# Patient Record
Sex: Female | Born: 1970 | Race: Black or African American | Hispanic: No | Marital: Married | Smoking: Never smoker
Health system: Southern US, Community
[De-identification: ages and names within clinical notes are randomized; demographics above are authoritative.]

## PROBLEM LIST (undated history)

## (undated) DIAGNOSIS — M797 Fibromyalgia: Secondary | ICD-10-CM

## (undated) DIAGNOSIS — G4730 Sleep apnea, unspecified: Secondary | ICD-10-CM

## (undated) DIAGNOSIS — M1990 Unspecified osteoarthritis, unspecified site: Secondary | ICD-10-CM

## (undated) HISTORY — PX: CYSTOSCOPY: SUR368

## (undated) HISTORY — PX: SYNOVECTOMY: SHX133

## (undated) HISTORY — PX: KNEE SURGERY: SHX244

## (undated) HISTORY — PX: ABDOMINAL HYSTERECTOMY: SHX81

## (undated) HISTORY — DX: Sleep apnea, unspecified: G47.30

## (undated) HISTORY — PX: CARPAL TUNNEL RELEASE: SHX101

## (undated) HISTORY — DX: Unspecified osteoarthritis, unspecified site: M19.90

---

## 2003-08-30 ENCOUNTER — Emergency Department (HOSPITAL_COMMUNITY): Admission: EM | Admit: 2003-08-30 | Discharge: 2003-08-30 | Payer: Self-pay | Admitting: Emergency Medicine

## 2014-06-17 ENCOUNTER — Other Ambulatory Visit: Payer: Self-pay | Admitting: Surgery

## 2014-06-18 ENCOUNTER — Other Ambulatory Visit: Payer: Self-pay | Admitting: Surgery

## 2014-07-16 ENCOUNTER — Encounter: Payer: BLUE CROSS/BLUE SHIELD | Attending: Surgery | Admitting: Dietician

## 2014-07-16 ENCOUNTER — Encounter: Payer: Self-pay | Admitting: Dietician

## 2014-07-16 ENCOUNTER — Ambulatory Visit (INDEPENDENT_AMBULATORY_CARE_PROVIDER_SITE_OTHER): Payer: BLUE CROSS/BLUE SHIELD | Admitting: Licensed Clinical Social Worker

## 2014-07-16 ENCOUNTER — Encounter (HOSPITAL_COMMUNITY)
Admission: RE | Disposition: A | Discharge status: Home / Self Care | Payer: Self-pay | Source: Ambulatory Visit | Attending: Surgery

## 2014-07-16 ENCOUNTER — Ambulatory Visit (HOSPITAL_COMMUNITY)
Admission: RE | Admit: 2014-07-16 | Discharge: 2014-07-16 | Disposition: A | Discharge status: Home / Self Care | Payer: BLUE CROSS/BLUE SHIELD | Source: Ambulatory Visit | Attending: Surgery | Admitting: Surgery

## 2014-07-16 DIAGNOSIS — F4322 Adjustment disorder with anxiety: Secondary | ICD-10-CM | POA: Diagnosis not present

## 2014-07-16 DIAGNOSIS — Z6837 Body mass index (BMI) 37.0-37.9, adult: Secondary | ICD-10-CM | POA: Insufficient documentation

## 2014-07-16 DIAGNOSIS — Z713 Dietary counseling and surveillance: Secondary | ICD-10-CM | POA: Diagnosis not present

## 2014-07-16 HISTORY — PX: BREATH TEK H PYLORI: SHX5422

## 2014-07-16 SURGERY — BREATH TEST, FOR HELICOBACTER PYLORI

## 2014-07-16 NOTE — Progress Notes (Signed)
   07/16/14 0915  BREATH TEK ASSESSMENT  Referring MD Dr Ovidio Kinavid Newman  Time of Last PO Intake 1330  Baseline Breath At: 0729  Pranactin Given At: 0732  Post-Dose Breath At: 0747  Sample 1 4.4%  Sample 2 3.4%  Test Postive

## 2014-07-16 NOTE — Progress Notes (Signed)
  Pre-Op Assessment Visit:  Pre-Operative Gastric sleeve Surgery  Medical Nutrition Therapy:  Appt start time: 1120   End time:  1200.  Patient was seen on 07/16/14 for Pre-Operative Nutrition Assessment. [facility] on 07/16/14.   Preferred Learning Style:   No preference indicated   Learning Readiness:   Ready  Handouts given during visit include:  Pre-Op Goals Bariatric Surgery Protein Shakes   During the appointment today the following Pre-Op Goals were reviewed with the patient: Maintain or lose weight as instructed by your surgeon Make healthy food choices Begin to limit portion sizes Limited concentrated sugars and fried foods Keep fat/sugar in the single digits per serving on   food labels Practice CHEWING your food  (aim for 30 chews per bite or until applesauce consistency) Practice not drinking 15 minutes before, during, and 30 minutes after each meal/snack Avoid all carbonated beverages  Avoid/limit caffeinated beverages  Avoid all sugar-sweetened beverages Consume 3 meals per day; eat every 3-5 hours Make a list of non-food related activities Aim for 64-100 ounces of FLUID daily  Aim for at least 60-80 grams of PROTEIN daily Look for a liquid protein source that contain ?15 g protein and ?5 g carbohydrate  (ex: shakes, drinks, shots)  Patient-Centered Goals: -Knee and back pain relief -Prevention of obesity-related health problems  Demonstrated degree of understanding via:  Teach Back  Teaching Method Utilized:  Visual Auditory Hands on  Barriers to learning/adherence to lifestyle change: none  Patient to call the [facility] to enroll in Pre-Op and Post-Op Nutrition Education when surgery date is scheduled.

## 2014-07-17 ENCOUNTER — Encounter (HOSPITAL_COMMUNITY): Payer: Self-pay | Admitting: Surgery

## 2014-07-29 ENCOUNTER — Ambulatory Visit (HOSPITAL_COMMUNITY)
Admission: RE | Admit: 2014-07-29 | Discharge: 2014-07-29 | Disposition: A | Discharge status: Home / Self Care | Payer: BLUE CROSS/BLUE SHIELD | Source: Ambulatory Visit | Attending: Surgery | Admitting: Surgery

## 2014-07-29 ENCOUNTER — Other Ambulatory Visit: Payer: Self-pay

## 2014-07-29 DIAGNOSIS — Z6837 Body mass index (BMI) 37.0-37.9, adult: Secondary | ICD-10-CM | POA: Insufficient documentation

## 2014-07-29 DIAGNOSIS — Z01818 Encounter for other preprocedural examination: Secondary | ICD-10-CM | POA: Insufficient documentation

## 2014-07-29 DIAGNOSIS — K5900 Constipation, unspecified: Secondary | ICD-10-CM | POA: Diagnosis not present

## 2014-08-05 ENCOUNTER — Ambulatory Visit (INDEPENDENT_AMBULATORY_CARE_PROVIDER_SITE_OTHER): Payer: BLUE CROSS/BLUE SHIELD | Admitting: Licensed Clinical Social Worker

## 2014-08-05 DIAGNOSIS — F4322 Adjustment disorder with anxiety: Secondary | ICD-10-CM

## 2014-08-11 ENCOUNTER — Ambulatory Visit: Payer: BLUE CROSS/BLUE SHIELD | Admitting: Dietician

## 2014-09-01 ENCOUNTER — Encounter (HOSPITAL_COMMUNITY)
Admission: RE | Disposition: A | Discharge status: Home / Self Care | Payer: Self-pay | Source: Ambulatory Visit | Attending: Surgery

## 2014-09-01 ENCOUNTER — Ambulatory Visit (HOSPITAL_COMMUNITY)
Admission: RE | Admit: 2014-09-01 | Discharge: 2014-09-01 | Disposition: A | Discharge status: Home / Self Care | Payer: BLUE CROSS/BLUE SHIELD | Source: Ambulatory Visit | Attending: Surgery | Admitting: Surgery

## 2014-09-01 DIAGNOSIS — Z0189 Encounter for other specified special examinations: Secondary | ICD-10-CM | POA: Insufficient documentation

## 2014-09-01 HISTORY — PX: BREATH TEK H PYLORI: SHX5422

## 2014-09-01 SURGERY — BREATH TEST, FOR HELICOBACTER PYLORI

## 2014-09-01 NOTE — Progress Notes (Signed)
   09/01/14 1002  BREATH TEK ASSESSMENT  Referring MD Dr Ezzard Standing   Time of Last PO Intake 2300  Baseline Breath At: 0848  Pranactin Given At: 0851  Post-Dose Breath At: 0906  Sample 1 4.5  Sample 2 2.6  Test Postive

## 2014-09-02 ENCOUNTER — Encounter (HOSPITAL_COMMUNITY): Payer: Self-pay | Admitting: Surgery

## 2014-09-08 ENCOUNTER — Encounter: Payer: Self-pay | Admitting: Dietician

## 2014-09-08 ENCOUNTER — Encounter: Payer: BLUE CROSS/BLUE SHIELD | Attending: Surgery | Admitting: Dietician

## 2014-09-08 DIAGNOSIS — Z713 Dietary counseling and surveillance: Secondary | ICD-10-CM | POA: Diagnosis not present

## 2014-09-08 DIAGNOSIS — Z6837 Body mass index (BMI) 37.0-37.9, adult: Secondary | ICD-10-CM | POA: Insufficient documentation

## 2014-09-08 NOTE — Progress Notes (Signed)
Supervised Weight Loss:  Appt start time: 1115 end time:  1130.  SWL visit 1:  Primary concerns today: Gorgeous returns today for her 1st SWL appointment in preparation for gastric sleeve having maintained her weight. She has been working on drinking less sweet tea and more water. She has also started taking Flintstones complete multivitamin every morening. Maurica tried chocolate Premier protein shake and likes it. Has been snacking on sugar free popsicles.   Weight: 213.5 lbs BMI: 37.9  Goals: -Start going to the gym and develop a workout routine   Patient-Centered Goals: -Knee and back pain relief -Prevention of obesity-related health problems  MEDICATIONS: see list  Estimated energy needs: 1600-1800 calories  Progress Towards Goal(s):  In progress.   Nutritional Diagnosis:  [place]-3.3 Overweight/obesity related to past poor dietary habits and physical inactivity as evidenced by patient in SWL for pending bariatric surgery following dietary guidelines for continued weight loss.     Intervention:  Nutrition counseling provided.  Samples provided and patient instructed on proper use: Unjury protein powder (chicken soup - qty 1) Lot#: [id number] Exp: 06/2015  Monitoring/Evaluation:  Dietary intake, exercise, and body weight in 4 week(s).

## 2014-10-06 ENCOUNTER — Encounter: Payer: Self-pay | Admitting: Dietician

## 2014-10-06 ENCOUNTER — Encounter: Payer: BLUE CROSS/BLUE SHIELD | Attending: Surgery | Admitting: Dietician

## 2014-10-06 DIAGNOSIS — Z713 Dietary counseling and surveillance: Secondary | ICD-10-CM | POA: Insufficient documentation

## 2014-10-06 DIAGNOSIS — Z6837 Body mass index (BMI) 37.0-37.9, adult: Secondary | ICD-10-CM | POA: Diagnosis not present

## 2014-10-06 NOTE — Progress Notes (Signed)
Supervised Weight Loss:  Appt start time: 1210 end time:  1225  SWL visit 2:  Primary concerns today: Alexa Henderson returns today for her 2nd SWL appointment in preparation for gastric sleeve having lost 2.5 pounds. The antibiotics for H Pylori are making her sick and fatigued. She has realized she likes plain water if it is very cold. She tried the chicken soup flavored Unjury protein powder and likes it. She has another Breath Tek scheduled on August 15. Alexa Henderson reports that she has been practicing not drinking while eating. She has not been ordering a drink at restaurants. Still taking Flintstones vitamins.   Weight: 211 lbs BMI: 37.5  Goals: -Start walking for 30 minutes 3x a week after work  Patient-Centered Goals: -Knee and back pain relief -Prevention of obesity-related health problems  MEDICATIONS: see list  Estimated energy needs: 1600-1800 calories  Progress Towards Goal(s):  In progress.   Nutritional Diagnosis:  [place]-3.3 Overweight/obesity related to past poor dietary habits and physical inactivity as evidenced by patient in SWL for pending bariatric surgery following dietary guidelines for continued weight loss.     Intervention:  Nutrition counseling provided.   Monitoring/Evaluation:  Dietary intake, exercise, and body weight in 4 week(s).

## 2014-11-05 ENCOUNTER — Ambulatory Visit: Payer: Self-pay | Admitting: Dietician

## 2014-11-11 ENCOUNTER — Encounter: Payer: BLUE CROSS/BLUE SHIELD | Attending: Surgery | Admitting: Dietician

## 2014-11-11 ENCOUNTER — Encounter: Payer: Self-pay | Admitting: Dietician

## 2014-11-11 DIAGNOSIS — Z713 Dietary counseling and surveillance: Secondary | ICD-10-CM | POA: Diagnosis not present

## 2014-11-11 DIAGNOSIS — Z6837 Body mass index (BMI) 37.0-37.9, adult: Secondary | ICD-10-CM | POA: Diagnosis not present

## 2014-11-11 NOTE — Progress Notes (Signed)
Supervised Weight Loss:  Appt start time: 155 end time:  210  SWL visit 3:  Primary concerns today: Alexa Henderson returns today for her 3rd SWL appointment in preparation for gastric sleeve having gained 6 pounds. She is done with her antibiotics for H. Pylori and has been feeling better. She has also recieved a steroid shot in the last month. She is still practicing not drinking while eating and is getting used to it; it is becoming more of a habit. Alexa Henderson has started walking after work most days for 30 minutes. She has a lot of support from family and coworkers.   Weight: 217 lbs BMI: 38.5  Goals: -Plan to go to the gym at least 2 times on the cruise  -Work on making healthy choices on vacation  -Focus on protein and vegetables  -Watch portion sizes  Patient-Centered Goals: -Knee and back pain relief -Prevention of obesity-related health problems  MEDICATIONS: see list  Estimated energy needs: 1600-1800 calories  Progress Towards Goal(s):  In progress.   Nutritional Diagnosis:  [place]-3.3 Overweight/obesity related to past poor dietary habits and physical inactivity as evidenced by patient in SWL for pending bariatric surgery following dietary guidelines for continued weight loss.     Intervention:  Nutrition counseling provided.   Monitoring/Evaluation:  Dietary intake, exercise, and body weight in 4 week(s).

## 2014-11-11 NOTE — Patient Instructions (Signed)
Goals: -Plan to go to the gym at least 2 times on the cruise  -Work on making healthy choices on vacation  -Focus on protein and vegetables  -Watch portion sizes

## 2014-12-04 ENCOUNTER — Encounter: Payer: Self-pay | Admitting: Dietician

## 2014-12-04 ENCOUNTER — Encounter: Payer: BLUE CROSS/BLUE SHIELD | Attending: Surgery | Admitting: Dietician

## 2014-12-04 DIAGNOSIS — Z6837 Body mass index (BMI) 37.0-37.9, adult: Secondary | ICD-10-CM | POA: Insufficient documentation

## 2014-12-04 DIAGNOSIS — Z713 Dietary counseling and surveillance: Secondary | ICD-10-CM | POA: Insufficient documentation

## 2014-12-04 NOTE — Progress Notes (Signed)
Supervised Weight Loss:  Appt start time: 1050 end time:  1105  SWL visit 4:  Primary concerns today: Saanvika returns today for her 4th SWL appointment in preparation for gastric sleeve having lost a pound despite going on a cruise. She tried to focus on protein foods and vegetables. She avoided fried foods and chose baked and broiled foods. Tried to watch portions and tried not to overeat. She worked on staying active on the cruise, walked a Interior and spatial designer. Has started taking bariatric vitamins. She feels like she has gotten into the habit of not drinking while eating.   Weight: 216.2 lbs BMI: 38.4  Goals: -Find a calcium supplement that you like  Patient-Centered Goals: -Knee and back pain relief -Prevention of obesity-related health problems  MEDICATIONS: see list  Estimated energy needs: 1600-1800 calories  Progress Towards Goal(s):  In progress.   Nutritional Diagnosis:  [place]-3.3 Overweight/obesity related to past poor dietary habits and physical inactivity as evidenced by patient in SWL for pending bariatric surgery following dietary guidelines for continued weight loss.     Intervention:  Nutrition counseling provided.   Monitoring/Evaluation:  Dietary intake, exercise, and body weight in 4 week(s).

## 2014-12-04 NOTE — Patient Instructions (Addendum)
200% of a complete multivitamin 1500 mg Calcium citrate (500 mg 3x a day); also chewable (Celebrate soft chew or Bariatric Advantage soft chew) Vitamin B12 (sublingual)

## 2015-01-02 ENCOUNTER — Encounter: Payer: BLUE CROSS/BLUE SHIELD | Attending: Surgery | Admitting: Dietician

## 2015-01-02 ENCOUNTER — Encounter: Payer: Self-pay | Admitting: Dietician

## 2015-01-02 DIAGNOSIS — Z713 Dietary counseling and surveillance: Secondary | ICD-10-CM | POA: Diagnosis not present

## 2015-01-02 DIAGNOSIS — Z6837 Body mass index (BMI) 37.0-37.9, adult: Secondary | ICD-10-CM | POA: Diagnosis not present

## 2015-01-02 NOTE — Progress Notes (Signed)
Supervised Weight Loss:  Appt start time: 1005 end time:  1020  SWL visit 5:  Primary concerns today: Alexa Henderson returns today for her 5th SWL appointment in preparation for gastric sleeve having gained a pound. Has not been able to take Linzess which helps her have bowel movements. She has been trying various sublingual B12 and Calcium citrate supplements. She has been taking Flintstones complete multivitamin and likes it. She is having surgery Monday for her carpal tunnel.   Weight: 217.3 lbs BMI: 38.6  Goals: -Find a calcium supplement that you like  Patient-Centered Goals: -Knee and back pain relief -Prevention of obesity-related health problems  MEDICATIONS: see list  Estimated energy needs: 1600-1800 calories  Progress Towards Goal(s):  In progress.   Nutritional Diagnosis:  [place]-3.3 Overweight/obesity related to past poor dietary habits and physical inactivity as evidenced by patient in SWL for pending bariatric surgery following dietary guidelines for continued weight loss.     Intervention:  Nutrition counseling provided.   Monitoring/Evaluation:  Dietary intake, exercise, and body weight in 4 week(s).

## 2015-01-30 ENCOUNTER — Encounter: Payer: BLUE CROSS/BLUE SHIELD | Attending: Surgery | Admitting: Dietician

## 2015-01-30 ENCOUNTER — Encounter: Payer: Self-pay | Admitting: Dietician

## 2015-01-30 DIAGNOSIS — Z6837 Body mass index (BMI) 37.0-37.9, adult: Secondary | ICD-10-CM | POA: Diagnosis not present

## 2015-01-30 DIAGNOSIS — Z713 Dietary counseling and surveillance: Secondary | ICD-10-CM | POA: Insufficient documentation

## 2015-01-30 NOTE — Progress Notes (Signed)
Supervised Weight Loss:  Appt start time: 1020 end time:  1035  SWL visit 6:  Primary concerns today: Alexa Henderson returns today for her 6th SWL appointment in preparation for gastric sleeve having lost 2 pounds. She recently had surgery for carpal tunnel. Has found recommended supplements and has begun taking them. She is feeling prepared for bariatric surgery.   Weight: 215.8 lbs BMI: 38.3  Goals: -Attend pre op class when surgery is scheduled  Patient-Centered Goals: -Knee and back pain relief -Prevention of obesity-related health problems  MEDICATIONS: see list  Estimated energy needs: 1600-1800 calories  Progress Towards Goal(s):  In progress.   Nutritional Diagnosis:  [place]-3.3 Overweight/obesity related to past poor dietary habits and physical inactivity as evidenced by patient in SWL for pending bariatric surgery following dietary guidelines for continued weight loss.     Intervention:  Nutrition counseling provided.   Monitoring/Evaluation:  Dietary intake, exercise, and body weight in 4 week(s).

## 2015-03-02 ENCOUNTER — Encounter: Payer: BLUE CROSS/BLUE SHIELD | Attending: Surgery

## 2015-03-02 DIAGNOSIS — Z6837 Body mass index (BMI) 37.0-37.9, adult: Secondary | ICD-10-CM | POA: Insufficient documentation

## 2015-03-02 DIAGNOSIS — Z713 Dietary counseling and surveillance: Secondary | ICD-10-CM | POA: Insufficient documentation

## 2015-03-03 NOTE — Progress Notes (Signed)
  Pre-Operative Nutrition Class:  Appt start time: 0177   End time:  1830.  Patient was seen on 03/02/15 for Pre-Operative Bariatric Surgery Education at [facility].   Surgery date: 03/17/15 Surgery type: sleeve gastrectomy [facility] weight at [facility]: 213 lbs on 07/16/14 Weight today: 216 lbs  TANITA  BODY COMP RESULTS  03/02/15   BMI (kg/m^2) 38.3   Fat Mass (lbs) 109   Fat Free Mass (lbs) 107   Total Body Water (lbs) 78.5   Samples given per MNT protocol. Patient educated on appropriate usage: Unjury protein powder (vanilla - qty 1) Lot #: [id number] Exp: 09/2015  Premier protein shake (strawberry - qty 1) Lot #: [id number] Exp: 11/2015  Celebrate Vitamins Multivitamin (berry - qty 1) Lot #: [id number] Exp: 05/2016  Celebrate Vitamins Calcium Citrate chew (caramel- qty 1) Lot #: [id number] Exp: 05/2016  PB2 (qty 1) Lot #: [id number] Exp: 12/2016  The following the learning objectives were met by the patient during this course:  Identify Pre-Op Dietary Goals and will begin 2 weeks pre-operatively  Identify appropriate sources of fluids and proteins   State protein recommendations and appropriate sources pre and post-operatively  Identify Post-Operative Dietary Goals and will follow for 2 weeks post-operatively  Identify appropriate multivitamin and calcium sources  Describe the need for physical activity post-operatively and will follow MD recommendations  State when to call healthcare provider regarding medication questions or post-operative complications  Handouts given during class include:  Pre-Op Bariatric Surgery Diet Handout  Protein Shake Handout  Post-Op Bariatric Surgery Nutrition Handout  BELT Program Information Flyer  Support Group Information Flyer  [facility] Bariatric Supplements Price List  Follow-Up Plan: Patient will follow-up at [facility] 2 weeks post operatively for diet advancement per MD.

## 2015-03-05 ENCOUNTER — Other Ambulatory Visit: Payer: Self-pay | Admitting: Surgery

## 2015-03-05 NOTE — Patient Instructions (Addendum)
YOUR PROCEDURE IS SCHEDULED ON :  03/17/15  REPORT TO [facility] MAIN ENTRANCE FOLLOW SIGNS TO EAST ELEVATOR - GO TO 3rd FLOOR CHECK IN AT 3 EAST NURSES STATION (SHORT STAY) AT:  7:00 AM  CALL THIS NUMBER IF YOU HAVE PROBLEMS THE MORNING OF SURGERY [phone]  REMEMBER:ONLY 1 PER PERSON MAY GO TO SHORT STAY WITH YOU TO GET READY THE MORNING OF YOUR SURGERY  DO NOT EAT FOOD OR DRINK LIQUIDS AFTER MIDNIGHT  TAKE THESE MEDICINES THE MORNING OF SURGERY: VANCOMYCIN  YOU MAY NOT HAVE ANY METAL ON YOUR BODY INCLUDING HAIR PINS AND PIERCING'S. DO NOT WEAR JEWELRY, MAKEUP, LOTIONS, POWDERS OR PERFUMES. DO NOT WEAR NAIL POLISH. DO NOT SHAVE 48 HRS PRIOR TO SURGERY. MEN MAY SHAVE FACE AND NECK.  DO NOT BRING VALUABLES TO HOSPITAL. [facility] IS NOT RESPONSIBLE FOR VALUABLES.  CONTACTS, DENTURES OR PARTIALS MAY NOT BE WORN TO SURGERY. LEAVE SUITCASE IN CAR. CAN BE BROUGHT TO ROOM AFTER SURGERY.  PATIENTS DISCHARGED THE DAY OF SURGERY WILL NOT BE ALLOWED TO DRIVE HOME.  PLEASE READ OVER THE FOLLOWING INSTRUCTION SHEETS _________________________________________________________________________________                                          [facility] - PREPARING FOR SURGERY  Before surgery, you can play an important role.  Because skin is not sterile, your skin needs to be as free of germs as possible.  You can reduce the number of germs on your skin by washing with CHG (chlorahexidine gluconate) soap before surgery.  CHG is an antiseptic cleaner which kills germs and bonds with the skin to continue killing germs even after washing. Please DO NOT use if you have an allergy to CHG or antibacterial soaps.  If your skin becomes reddened/irritated stop using the CHG and inform your nurse when you arrive at Short Stay. Do not shave (including legs and underarms) for at least 48 hours prior to the first CHG shower.  You may shave your face. Please follow these instructions  carefully:   1.  Shower with CHG Soap the night before surgery and the  morning of Surgery.   2.  If you choose to wash your hair, wash your hair first as usual with your  normal  Shampoo.   3.  After you shampoo, rinse your hair and body thoroughly to remove the  shampoo.                                         4.  Use CHG as you would any other liquid soap.  You can apply chg directly  to the skin and wash . Gently wash with scrungie or clean wascloth    5.  Apply the CHG Soap to your body ONLY FROM THE NECK DOWN.   Do not use on open                           Wound or open sores. Avoid contact with eyes, ears mouth and genitals (private parts).                        Genitals (private parts) with your normal soap.  6.  Wash thoroughly, paying special attention to the area where your surgery  will be performed.   7.  Thoroughly rinse your body with warm water from the neck down.   8.  DO NOT shower/wash with your normal soap after using and rinsing off  the CHG Soap .                9.  Pat yourself dry with a clean towel.             10.  Wear clean night clothes to bed after shower             11.  Place clean sheets on your bed the night of your first shower and do not  sleep with pets.  Day of Surgery : Do not apply any lotions/deodorants the morning of surgery.  Please wear clean clothes to the hospital/surgery center.  FAILURE TO FOLLOW THESE INSTRUCTIONS MAY RESULT IN THE CANCELLATION OF YOUR SURGERY    PATIENT SIGNATURE_________________________________  ______________________________________________________________________

## 2015-03-11 ENCOUNTER — Encounter (HOSPITAL_COMMUNITY)
Admission: RE | Admit: 2015-03-11 | Discharge: 2015-03-11 | Disposition: A | Discharge status: Home / Self Care | Payer: BLUE CROSS/BLUE SHIELD | Source: Ambulatory Visit | Attending: Surgery | Admitting: Surgery

## 2015-03-11 ENCOUNTER — Encounter (HOSPITAL_COMMUNITY): Payer: Self-pay

## 2015-03-11 DIAGNOSIS — Z01812 Encounter for preprocedural laboratory examination: Secondary | ICD-10-CM | POA: Diagnosis not present

## 2015-03-11 DIAGNOSIS — Z6837 Body mass index (BMI) 37.0-37.9, adult: Secondary | ICD-10-CM | POA: Diagnosis not present

## 2015-03-11 HISTORY — DX: Fibromyalgia: M79.7

## 2015-03-11 LAB — CBC WITH DIFFERENTIAL/PLATELET
BASOS ABS: 0 10*3/uL (ref 0.0–0.1)
BASOS PCT: 0 %
EOS ABS: 0.1 10*3/uL (ref 0.0–0.7)
Eosinophils Relative: 1 %
HEMATOCRIT: 35.6 % — AB (ref 36.0–46.0)
HEMOGLOBIN: 12.2 g/dL (ref 12.0–15.0)
Lymphocytes Relative: 36 %
Lymphs Abs: 2.5 10*3/uL (ref 0.7–4.0)
MCH: 29.6 pg (ref 26.0–34.0)
MCHC: 34.3 g/dL (ref 30.0–36.0)
MCV: 86.4 fL (ref 78.0–100.0)
MONOS PCT: 7 %
Monocytes Absolute: 0.5 10*3/uL (ref 0.1–1.0)
NEUTROS ABS: 3.8 10*3/uL (ref 1.7–7.7)
NEUTROS PCT: 56 %
PLATELETS: 245 10*3/uL (ref 150–400)
RBC: 4.12 MIL/uL (ref 3.87–5.11)
RDW: 12.9 % (ref 11.5–15.5)
WBC: 6.8 10*3/uL (ref 4.0–10.5)

## 2015-03-11 LAB — COMPREHENSIVE METABOLIC PANEL
ALBUMIN: 4.3 g/dL (ref 3.5–5.0)
ALT: 11 U/L — AB (ref 14–54)
AST: 17 U/L (ref 15–41)
Alkaline Phosphatase: 66 U/L (ref 38–126)
Anion gap: 9 (ref 5–15)
BUN: 19 mg/dL (ref 6–20)
CHLORIDE: 110 mmol/L (ref 101–111)
CO2: 24 mmol/L (ref 22–32)
CREATININE: 0.91 mg/dL (ref 0.44–1.00)
Calcium: 9.7 mg/dL (ref 8.9–10.3)
GFR calc Af Amer: >60 mL/min (ref >60)
GFR calc non Af Amer: >60 mL/min (ref >60)
GLUCOSE: 99 mg/dL (ref 65–99)
POTASSIUM: 4.2 mmol/L (ref 3.5–5.1)
Sodium: 143 mmol/L (ref 135–145)
Total Bilirubin: 1.2 mg/dL (ref 0.3–1.2)
Total Protein: 7.8 g/dL (ref 6.5–8.1)

## 2015-03-16 NOTE — H&P (Signed)
Alexa Henderson  Location: [facility] Patient #: [id number] DOB: 07/22/1970 Married / Language: English / Race: Black or African American Female  History of Present Illness   The patient is a 44 year old female who presents for a bariatric surgery evaluation.   Her PCP is [facility].  She comes by herself. Her initial weight was 214 and BMI 36.8.   She is set for surgery on 03/17/2015 for a sleeve gastrectomy. She is in very good spirits. Her husband did not come, because he works 2nd shift and her appt was moved today.  She has started her pre op diet. She has seen Verlon Au with dietary.  She had "food poisoining" a couple of weeks ago. She went to the [facility] ER, had a CT scan which suggested a liver mass. She got an MRI of her abdomen, which was negative. She was also found to be C. Diff. positive, but is not sick. But her GI physician, Milderd Meager, has called in Vancomycin for 10 days. Not sure about the treatment of asymptomatic C. Diff. (though she did have the "food poisoning".  UGI - 07/29/2014 - normal [place] of abdomen - 07/29/2014 - normal She saw Judithe Modest for psychiatry pre op  History of weight problems: She listened to the online seminar about weight loss surgery. She is interested in the sleeve gastrectomy. She has a friend, Morrie Sheldon, who lives in [place] who had the surgery and has given her insight. Her mother's sister also had weight loss surgery, though she is unsure which one. She is motivated in large part by her parents health. Her father died a 42 with medical conditions. Her mother is going to breast cancer reconstruction and has her own medical issues. She's tried multiple diets including: Weight Watchers, Atkins diet, Optifast, Slim fast. She's tried exercise such as Zumba. She's tried medication included Meridia and phentermine.  She seems well educated about the operations.  Per the 1991 NIH  Consensus Statement, the patient is a candidate for bariatric surgery. The patient attended our initial information session and reviewed the types of bariatric surgery.  The patient is interested in the sleeve gastrectomy. I discussed with the patient the indications and risks of bariatric surgery. The potential risks of surgery include, but are not limited to, bleeding, infection, leak from the bowel, DVT and PE, open surgery, long term nutrition consequences, and death. The patient understands the importance of compliance and long term follow-up with our group after surgery.  Past Medical History: 1. BP spike required hospitlizatiojn in 2009. Not on BP meds 2. Sleep apnea, but on no CPAP She was last tested about 3 years ago. 3. Hysterectomy 2013 for benign disease 4. Colonoscopy in Jan 2016 - negative. This was for constipation. 5. 3 right knee operations (1999, 2001, and 2003) and 1 left knee operation (2006) She is not actively being followed by ortho 6. L5-S1 disk disese She has had injections, but is doing okay now 7. 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 None since then. 8. Breath Tek initially positive.  Social/ Fam history: Married. She works with Production designer, theatre/television/film She has a daughter - 22yo.   Other Problems Kandis Cocking, MD; 03/05/2015 3:44 PM) Back Pain Pulmonary Embolism / Blood Clot in Legs Sleep Apnea  Past Surgical History Kandis Cocking, MD; 03/05/2015 3:44 PM) Cesarean Section - 1 Hysterectomy (not due to cancer) - Complete Knee Surgery Bilateral.  Allergies Fay Records, CMA; 03/05/2015 3:39 PM)  Penicillamine *ASSORTED CLASSES*  Medication History Fay Records(Ashley Beck, CMA; 03/05/2015 3:39 PM) OxyCODONE HCl (5MG /5ML Solution, 5-10 Milliliter Oral every four hours, as needed, Taken starting 03/05/2015) Active. Protonix (40MG  Tablet DR, 1 (one) Tablet Oral daily, Taken starting 03/05/2015)  Active. Zofran ODT (4MG  Tablet Disperse, 1 (one) Tablet Disperse Oral every six hours, as needed, Taken starting 03/05/2015) Active. Diflucan (150MG  Tablet, one Tablet Oral one time dose, Taken starting 07/17/2014) Active. (DO NOT TAKE UNTIL YOU HAVE FINISHED YOUR ANTIBIOTICS) Linzess (145MCG Capsule, Oral) Active. Medications Reconciled  Vitals Fay Records(Ashley Beck CMA; 03/05/2015 3:40 PM) 03/05/2015 3:39 PM Weight: 213.2 lb Height: 64in Body Surface Area: 2.01 m Body Mass Index: 36.6 kg/m  Temp.: 97.62F(Temporal)  Pulse: 80 (Regular)  BP: 132/78 (Sitting, Left Arm, Standard)   Physical Exam  General: Morbidly obese AA F alert and generally healthy appearing. HEENT: Normal. Pupils equal.  Neck: Supple. No mass. No thyroid mass.  Lymph Nodes: No supraclavicular or cervical nodes.  Lungs: Clear to auscultation and symmetric breath sounds. Heart: RRR. No murmur or rub.  Breasts: right - no mass Left - no mass  Abdomen: Soft. No mass. No tenderness. No hernia. Normal bowel sounds. Pfannenstiel incision. She is about 1/2 pear and 1/2 apple Rectal: Not done. Recent colonoscopy.  Extremities: Good strength and ROM in upper and lower extremities.  Neurologic: Grossly intact to motor and sensory function. Psychiatric: Has normal mood and affect. Behavior is normal.  Assessment & Plan  1.  MORBID OBESITY (E66.01)  Impression: For sleeve gastrectomy - 03/17/2015   Prescriptions for oxycodon, Zofran, and protonix sent in.  2. BP spike required hospitlizatiojn in 2009.  Not on BP meds 3. Sleep apnea, but on no CPAP  She was last tested about 3 years ago. 4. Hysterectomy 2013 for benign disease 5. 3 right knee operations (1999, 2001, and 2003) and 1 left knee operation (2006)  She is not actively being followed by ortho 6. L5-S1 disk disese  She has had injections, but is doing okay now 7. History of right leg DVT after  first knee surgery. None since then. 8. Breath Tek initially positive. 9.  Recent C. Diff   Ovidio Kinavid Tamika Nou, MD, [facility] Pager: [phone] Office phone:  [phone]

## 2015-03-17 ENCOUNTER — Inpatient Hospital Stay (HOSPITAL_COMMUNITY): Payer: BLUE CROSS/BLUE SHIELD | Admitting: Anesthesiology

## 2015-03-17 ENCOUNTER — Encounter (HOSPITAL_COMMUNITY)
Admission: AD | Disposition: A | Discharge status: Home / Self Care | Payer: Self-pay | Source: Ambulatory Visit | Attending: Surgery

## 2015-03-17 ENCOUNTER — Encounter (HOSPITAL_COMMUNITY): Payer: Self-pay | Admitting: *Deleted

## 2015-03-17 ENCOUNTER — Inpatient Hospital Stay (HOSPITAL_COMMUNITY)
Admission: AD | Admit: 2015-03-17 | Discharge: 2015-03-19 | DRG: 621 | Disposition: A | Discharge status: Home / Self Care | Payer: BLUE CROSS/BLUE SHIELD | Source: Ambulatory Visit | Attending: Surgery | Admitting: Surgery

## 2015-03-17 DIAGNOSIS — Z79899 Other long term (current) drug therapy: Secondary | ICD-10-CM | POA: Diagnosis not present

## 2015-03-17 DIAGNOSIS — Z01812 Encounter for preprocedural laboratory examination: Secondary | ICD-10-CM | POA: Diagnosis not present

## 2015-03-17 DIAGNOSIS — G4730 Sleep apnea, unspecified: Secondary | ICD-10-CM | POA: Diagnosis present

## 2015-03-17 DIAGNOSIS — Z86711 Personal history of pulmonary embolism: Secondary | ICD-10-CM | POA: Diagnosis not present

## 2015-03-17 DIAGNOSIS — M797 Fibromyalgia: Secondary | ICD-10-CM | POA: Diagnosis present

## 2015-03-17 DIAGNOSIS — Z6836 Body mass index (BMI) 36.0-36.9, adult: Secondary | ICD-10-CM

## 2015-03-17 DIAGNOSIS — Z86718 Personal history of other venous thrombosis and embolism: Secondary | ICD-10-CM | POA: Diagnosis not present

## 2015-03-17 DIAGNOSIS — Z803 Family history of malignant neoplasm of breast: Secondary | ICD-10-CM

## 2015-03-17 DIAGNOSIS — M1990 Unspecified osteoarthritis, unspecified site: Secondary | ICD-10-CM | POA: Diagnosis present

## 2015-03-17 DIAGNOSIS — Z90710 Acquired absence of both cervix and uterus: Secondary | ICD-10-CM | POA: Diagnosis not present

## 2015-03-17 DIAGNOSIS — Z9884 Bariatric surgery status: Secondary | ICD-10-CM

## 2015-03-17 HISTORY — PX: LAPAROSCOPIC GASTRIC SLEEVE RESECTION: SHX5895

## 2015-03-17 LAB — URINE MICROSCOPIC-ADD ON: BACTERIA UA: NONE SEEN

## 2015-03-17 LAB — HEMOGLOBIN AND HEMATOCRIT, BLOOD
HEMATOCRIT: 32.1 % — AB (ref 36.0–46.0)
Hemoglobin: 11.2 g/dL — ABNORMAL LOW (ref 12.0–15.0)

## 2015-03-17 LAB — URINALYSIS, ROUTINE W REFLEX MICROSCOPIC
Bilirubin Urine: NEGATIVE
GLUCOSE, UA: NEGATIVE mg/dL
Ketones, ur: 15 mg/dL — AB
LEUKOCYTES UA: NEGATIVE
Nitrite: NEGATIVE
PROTEIN: NEGATIVE mg/dL
SPECIFIC GRAVITY, URINE: 1.015 (ref 1.005–1.030)
pH: 5.5 (ref 5.0–8.0)

## 2015-03-17 SURGERY — GASTRECTOMY, SLEEVE, LAPAROSCOPIC
Anesthesia: General

## 2015-03-17 MED ORDER — ROCURONIUM BROMIDE 100 MG/10ML IV SOLN
INTRAVENOUS | Status: DC | PRN
  Administered 2015-03-17: 30 mg via INTRAVENOUS
  Administered 2015-03-17: 10 mg via INTRAVENOUS

## 2015-03-17 MED ORDER — TISSEEL VH 10 ML EX KIT
PACK | CUTANEOUS | Status: DC | PRN
  Administered 2015-03-17: 1

## 2015-03-17 MED ORDER — LACTATED RINGERS IV SOLN
INTRAVENOUS | Status: DC
  Administered 2015-03-17: 1000 mL via INTRAVENOUS

## 2015-03-17 MED ORDER — MIDAZOLAM HCL 5 MG/5ML IJ SOLN
INTRAMUSCULAR | Status: DC | PRN
  Administered 2015-03-17: 2 mg via INTRAVENOUS

## 2015-03-17 MED ORDER — CHLORHEXIDINE GLUCONATE 4 % EX LIQD: 60.0000 mL | Freq: Once | CUTANEOUS | Status: DC

## 2015-03-17 MED ORDER — LACTATED RINGERS IR SOLN
Status: DC | PRN
  Administered 2015-03-17: 1

## 2015-03-17 MED ORDER — SUCCINYLCHOLINE CHLORIDE 20 MG/ML IJ SOLN
INTRAMUSCULAR | Status: DC | PRN
  Administered 2015-03-17: 100 mg via INTRAVENOUS

## 2015-03-17 MED ORDER — LIDOCAINE HCL (CARDIAC) 20 MG/ML IV SOLN
INTRAVENOUS | Status: AC
  Filled 2015-03-17: qty 5

## 2015-03-17 MED ORDER — NEOSTIGMINE METHYLSULFATE 10 MG/10ML IV SOLN
INTRAVENOUS | Status: DC | PRN
  Administered 2015-03-17: 4 mg via INTRAVENOUS

## 2015-03-17 MED ORDER — HEPARIN SODIUM (PORCINE) 5000 UNIT/ML IJ SOLN
5000.0000 [IU] | INTRAMUSCULAR | Status: AC
  Administered 2015-03-17: 5000 [IU] via SUBCUTANEOUS
  Filled 2015-03-17: qty 1

## 2015-03-17 MED ORDER — FENTANYL CITRATE (PF) 250 MCG/5ML IJ SOLN
INTRAMUSCULAR | Status: DC | PRN
  Administered 2015-03-17: 100 ug via INTRAVENOUS
  Administered 2015-03-17 (×3): 50 ug via INTRAVENOUS
  Administered 2015-03-17: 100 ug via INTRAVENOUS

## 2015-03-17 MED ORDER — DEXAMETHASONE SODIUM PHOSPHATE 10 MG/ML IJ SOLN
INTRAMUSCULAR | Status: AC
  Filled 2015-03-17: qty 1

## 2015-03-17 MED ORDER — ONDANSETRON HCL 4 MG/2ML IJ SOLN
INTRAMUSCULAR | Status: DC | PRN
  Administered 2015-03-17: 4 mg via INTRAVENOUS

## 2015-03-17 MED ORDER — DEXAMETHASONE SODIUM PHOSPHATE 10 MG/ML IJ SOLN
INTRAMUSCULAR | Status: DC | PRN
  Administered 2015-03-17: 10 mg via INTRAVENOUS

## 2015-03-17 MED ORDER — ACETAMINOPHEN 160 MG/5ML PO SOLN: 650.0000 mg | ORAL | Status: DC | PRN

## 2015-03-17 MED ORDER — BUPIVACAINE-EPINEPHRINE (PF) 0.25% -1:200000 IJ SOLN
INTRAMUSCULAR | Status: AC
  Filled 2015-03-17: qty 30

## 2015-03-17 MED ORDER — PROMETHAZINE HCL 25 MG/ML IJ SOLN: 6.2500 mg | INTRAMUSCULAR | Status: DC | PRN

## 2015-03-17 MED ORDER — BUPIVACAINE HCL (PF) 0.25 % IJ SOLN
INTRAMUSCULAR | Status: AC
  Filled 2015-03-17: qty 30

## 2015-03-17 MED ORDER — HYDROMORPHONE HCL 1 MG/ML IJ SOLN
INTRAMUSCULAR | Status: AC
  Filled 2015-03-17: qty 1

## 2015-03-17 MED ORDER — ACETAMINOPHEN 160 MG/5ML PO SOLN: 325.0000 mg | ORAL | Status: DC | PRN

## 2015-03-17 MED ORDER — MORPHINE SULFATE (PF) 2 MG/ML IV SOLN
2.0000 mg | INTRAVENOUS | Status: DC | PRN
  Administered 2015-03-17 – 2015-03-18 (×6): 4 mg via INTRAVENOUS
  Administered 2015-03-18: 2 mg via INTRAVENOUS
  Filled 2015-03-17 (×2): qty 2
  Filled 2015-03-17: qty 1
  Filled 2015-03-17 (×5): qty 2
  Filled 2015-03-17: qty 1

## 2015-03-17 MED ORDER — PROPOFOL 10 MG/ML IV BOLUS
INTRAVENOUS | Status: DC | PRN
  Administered 2015-03-17: 150 mg via INTRAVENOUS

## 2015-03-17 MED ORDER — FENTANYL CITRATE (PF) 100 MCG/2ML IJ SOLN
INTRAMUSCULAR | Status: AC
  Filled 2015-03-17: qty 2

## 2015-03-17 MED ORDER — 0.9 % SODIUM CHLORIDE (POUR BTL) OPTIME
TOPICAL | Status: DC | PRN
  Administered 2015-03-17: 1000 mL

## 2015-03-17 MED ORDER — CHLORHEXIDINE GLUCONATE 4 % EX LIQD
60.0000 mL | Freq: Once | CUTANEOUS | Status: DC
Start: 2015-03-17 — End: 2015-03-17

## 2015-03-17 MED ORDER — FENTANYL CITRATE (PF) 250 MCG/5ML IJ SOLN
INTRAMUSCULAR | Status: AC
  Filled 2015-03-17: qty 5

## 2015-03-17 MED ORDER — PROPOFOL 10 MG/ML IV BOLUS
INTRAVENOUS | Status: AC
  Filled 2015-03-17: qty 20

## 2015-03-17 MED ORDER — MIDAZOLAM HCL 2 MG/2ML IJ SOLN
INTRAMUSCULAR | Status: AC
  Filled 2015-03-17: qty 2

## 2015-03-17 MED ORDER — HYDROMORPHONE HCL 1 MG/ML IJ SOLN
0.2500 mg | INTRAMUSCULAR | Status: DC | PRN
  Administered 2015-03-17 (×2): 0.5 mg via INTRAVENOUS

## 2015-03-17 MED ORDER — ONDANSETRON HCL 4 MG/2ML IJ SOLN
4.0000 mg | INTRAMUSCULAR | Status: DC | PRN
  Administered 2015-03-17 – 2015-03-18 (×6): 4 mg via INTRAVENOUS
  Filled 2015-03-17 (×8): qty 2

## 2015-03-17 MED ORDER — GLYCOPYRROLATE 0.2 MG/ML IJ SOLN
INTRAMUSCULAR | Status: DC | PRN
  Administered 2015-03-17: 0.6 mg via INTRAVENOUS

## 2015-03-17 MED ORDER — GLYCOPYRROLATE 0.2 MG/ML IJ SOLN
INTRAMUSCULAR | Status: AC
  Filled 2015-03-17: qty 3

## 2015-03-17 MED ORDER — MEPERIDINE HCL 50 MG/ML IJ SOLN: 6.2500 mg | INTRAMUSCULAR | Status: DC | PRN

## 2015-03-17 MED ORDER — ROCURONIUM BROMIDE 100 MG/10ML IV SOLN
INTRAVENOUS | Status: AC
  Filled 2015-03-17: qty 1

## 2015-03-17 MED ORDER — CEFOTETAN DISODIUM-DEXTROSE 2-2.08 GM-% IV SOLR
INTRAVENOUS | Status: AC
  Filled 2015-03-17: qty 50

## 2015-03-17 MED ORDER — BUPIVACAINE HCL 0.25 % IJ SOLN
INTRAMUSCULAR | Status: DC | PRN
  Administered 2015-03-17: 30 mL

## 2015-03-17 MED ORDER — PREMIER PROTEIN SHAKE
2.0000 [oz_av] | Freq: Four times a day (QID) | ORAL | Status: DC
Start: 2015-03-19 — End: 2015-03-19
  Administered 2015-03-19: 2 [oz_av] via ORAL

## 2015-03-17 MED ORDER — ONDANSETRON HCL 4 MG/2ML IJ SOLN
INTRAMUSCULAR | Status: AC
  Filled 2015-03-17: qty 2

## 2015-03-17 MED ORDER — POTASSIUM CHLORIDE IN NACL 20-0.45 MEQ/L-% IV SOLN
INTRAVENOUS | Status: DC
  Administered 2015-03-17 (×2): via INTRAVENOUS
  Administered 2015-03-18: 125 mL/h via INTRAVENOUS
  Administered 2015-03-18: 06:00:00 via INTRAVENOUS
  Filled 2015-03-17 (×8): qty 1000

## 2015-03-17 MED ORDER — NEOSTIGMINE METHYLSULFATE 10 MG/10ML IV SOLN
INTRAVENOUS | Status: AC
  Filled 2015-03-17: qty 1

## 2015-03-17 MED ORDER — OXYCODONE HCL 5 MG/5ML PO SOLN: 5.0000 mg | ORAL | Status: DC | PRN

## 2015-03-17 MED ORDER — HEPARIN SODIUM (PORCINE) 5000 UNIT/ML IJ SOLN
5000.0000 [IU] | Freq: Three times a day (TID) | INTRAMUSCULAR | Status: DC
  Administered 2015-03-17 – 2015-03-19 (×5): 5000 [IU] via SUBCUTANEOUS
  Filled 2015-03-17 (×8): qty 1

## 2015-03-17 MED ORDER — CEFOTETAN DISODIUM-DEXTROSE 2-2.08 GM-% IV SOLR
2.0000 g | INTRAVENOUS | Status: AC
  Administered 2015-03-17: 2 g via INTRAVENOUS

## 2015-03-17 MED ORDER — LACTATED RINGERS IV SOLN
INTRAVENOUS | Status: DC | PRN
  Administered 2015-03-17: 09:00:00 via INTRAVENOUS

## 2015-03-17 MED ORDER — LIDOCAINE HCL (CARDIAC) 20 MG/ML IV SOLN
INTRAVENOUS | Status: DC | PRN
  Administered 2015-03-17: 75 mg via INTRATRACHEAL

## 2015-03-17 MED ORDER — TISSEEL VH 10 ML EX KIT
PACK | CUTANEOUS | Status: AC
  Filled 2015-03-17: qty 2

## 2015-03-17 MED ORDER — STERILE WATER FOR IRRIGATION IR SOLN
Status: DC | PRN
  Administered 2015-03-17: 2000 mL

## 2015-03-17 SURGICAL SUPPLY — 58 items
APPLICATOR COTTON TIP 6IN STRL (MISCELLANEOUS) IMPLANT
APPLIER CLIP ROT 10 11.4 M/L (STAPLE) ×3
APPLIER CLIP ROT 13.4 12 LRG (CLIP)
BLADE SURG 15 STRL LF DISP TIS (BLADE) ×1 IMPLANT
BLADE SURG 15 STRL SS (BLADE) ×2
CABLE HIGH FREQUENCY MONO STRZ (ELECTRODE) IMPLANT
CHLORAPREP W/TINT 26ML (MISCELLANEOUS) ×3 IMPLANT
CLIP APPLIE ROT 10 11.4 M/L (STAPLE) ×1 IMPLANT
CLIP APPLIE ROT 13.4 12 LRG (CLIP) IMPLANT
COVER SURGICAL LIGHT HANDLE (MISCELLANEOUS) ×3 IMPLANT
DEVICE SUTURE ENDOST 10MM (ENDOMECHANICALS) IMPLANT
DEVICE TROCAR PUNCTURE CLOSURE (ENDOMECHANICALS) IMPLANT
DISSECTOR BLUNT TIP ENDO 5MM (MISCELLANEOUS) ×3 IMPLANT
DRAPE CAMERA CLOSED 9X96 (DRAPES) ×3 IMPLANT
DRAPE UTILITY XL STRL (DRAPES) ×6 IMPLANT
ELECT REM PT RETURN 9FT ADLT (ELECTROSURGICAL) ×3
ELECTRODE REM PT RTRN 9FT ADLT (ELECTROSURGICAL) ×1 IMPLANT
GAUZE SPONGE 4X4 12PLY STRL (GAUZE/BANDAGES/DRESSINGS) IMPLANT
GLOVE SURG SIGNA 7.5 PF LTX (GLOVE) ×3 IMPLANT
GOWN STRL REUS W/TWL XL LVL3 (GOWN DISPOSABLE) ×9 IMPLANT
HOVERMATT SINGLE USE (MISCELLANEOUS) ×3 IMPLANT
KIT BASIN OR (CUSTOM PROCEDURE TRAY) ×3 IMPLANT
LIQUID BAND (GAUZE/BANDAGES/DRESSINGS) ×3 IMPLANT
MARKER SKIN DUAL TIP RULER LAB (MISCELLANEOUS) ×3 IMPLANT
NEEDLE SPNL 22GX3.5 QUINCKE BK (NEEDLE) ×3 IMPLANT
PACK UNIVERSAL I (CUSTOM PROCEDURE TRAY) ×3 IMPLANT
RELOAD STAPLER BLUE 60MM (STAPLE) ×4 IMPLANT
RELOAD STAPLER GOLD 60MM (STAPLE) ×1 IMPLANT
RELOAD STAPLER GREEN 60MM (STAPLE) ×2 IMPLANT
SCISSORS LAP 5X35 DISP (ENDOMECHANICALS) IMPLANT
SEALANT SURGICAL APPL DUAL CAN (MISCELLANEOUS) ×3 IMPLANT
SET IRRIG TUBING LAPAROSCOPIC (IRRIGATION / IRRIGATOR) ×3 IMPLANT
SHEARS HARMONIC ACE PLUS 45CM (MISCELLANEOUS) ×3 IMPLANT
SLEEVE ADV FIXATION 5X100MM (TROCAR) ×3 IMPLANT
SLEEVE GASTRECTOMY 36FR VISIGI (MISCELLANEOUS) ×3 IMPLANT
SOLUTION ANTI FOG 6CC (MISCELLANEOUS) ×3 IMPLANT
SPONGE LAP 18X18 X RAY DECT (DISPOSABLE) ×3 IMPLANT
STAPLER ECHELON LONG 60 440 (INSTRUMENTS) IMPLANT
STAPLER RELOAD BLUE 60MM (STAPLE) ×12
STAPLER RELOAD GOLD 60MM (STAPLE) ×3
STAPLER RELOAD GREEN 60MM (STAPLE) ×6
SUT ETHILON 2 0 PS N (SUTURE) IMPLANT
SUT MNCRL AB 4-0 PS2 18 (SUTURE) ×3 IMPLANT
SYR 10ML ECCENTRIC (SYRINGE) ×3 IMPLANT
SYR 20CC LL (SYRINGE) ×3 IMPLANT
TOWEL OR 17X26 10 PK STRL BLUE (TOWEL DISPOSABLE) ×3 IMPLANT
TOWEL OR NON WOVEN STRL DISP B (DISPOSABLE) ×3 IMPLANT
TRAY FOLEY W/METER SILVER 14FR (SET/KITS/TRAYS/PACK) ×3 IMPLANT
TRAY FOLEY W/METER SILVER 16FR (SET/KITS/TRAYS/PACK) ×3 IMPLANT
TROCAR ADV FIXATION 12X100MM (TROCAR) ×6 IMPLANT
TROCAR ADV FIXATION 5X100MM (TROCAR) ×3 IMPLANT
TROCAR BLADELESS 15MM (ENDOMECHANICALS) ×3 IMPLANT
TROCAR BLADELESS OPT 5 100 (ENDOMECHANICALS) ×3 IMPLANT
TROCAR XCEL 12X100 BLDLESS (ENDOMECHANICALS) ×3 IMPLANT
TUBING CONNECTING 10 (TUBING) ×2 IMPLANT
TUBING CONNECTING 10' (TUBING) ×1
TUBING ENDO SMARTCAP PENTAX (MISCELLANEOUS) ×3 IMPLANT
TUBING FILTER THERMOFLATOR (ELECTROSURGICAL) ×3 IMPLANT

## 2015-03-17 NOTE — Progress Notes (Signed)
Patient voiced concerns about malodorous urine and said that she might have a UTI. Denied burning on urination. Dr Maisie Fushomas notified and order received for urinalysis.

## 2015-03-17 NOTE — Anesthesia Procedure Notes (Signed)
Procedure Name: Intubation Date/Time: 03/17/2015 11:01 AM Performed by: Delphia GratesHANDLER, Harvel Meskill Pre-anesthesia Checklist: Emergency Drugs available, Suction available, Patient being monitored and Patient identified Patient Re-evaluated:Patient Re-evaluated prior to inductionOxygen Delivery Method: Circle system utilized Preoxygenation: Pre-oxygenation with 100% oxygen Intubation Type: IV induction Laryngoscope Size: Mac and 4 Grade View: Grade I Tube type: Oral Number of attempts: 1 Airway Equipment and Method: Stylet Placement Confirmation: ETT inserted through vocal cords under direct vision and positive ETCO2 Secured at: 22 cm Tube secured with: Tape Dental Injury: Teeth and Oropharynx as per pre-operative assessment

## 2015-03-17 NOTE — Anesthesia Preprocedure Evaluation (Signed)
Anesthesia Evaluation  Patient identified by MRN, date of birth, ID band Patient awake    Reviewed: Allergy & Precautions, NPO status , Patient's Chart, lab work & pertinent test results  Airway Mallampati: II  TM Distance: >3 FB Neck ROM: Full    Dental no notable dental hx.    Pulmonary sleep apnea ,    Pulmonary exam normal breath sounds clear to auscultation       Cardiovascular negative cardio ROS Normal cardiovascular exam Rhythm:Regular Rate:Normal     Neuro/Psych negative neurological ROS  negative psych ROS   GI/Hepatic negative GI ROS, Neg liver ROS,   Endo/Other  Morbid obesity  Renal/GU negative Renal ROS     Musculoskeletal negative musculoskeletal ROS (+) Arthritis , Fibromyalgia -  Abdominal   Peds  Hematology negative hematology ROS (+)   Anesthesia Other Findings   Reproductive/Obstetrics negative OB ROS                             Anesthesia Physical Anesthesia Plan  ASA: III  Anesthesia Plan: General   Post-op Pain Management:    Induction: Intravenous  Airway Management Planned: Oral ETT  Additional Equipment:   Intra-op Plan:   Post-operative Plan: Extubation in OR  Informed Consent: I have reviewed the patients History and Physical, chart, labs and discussed the procedure including the risks, benefits and alternatives for the proposed anesthesia with the patient or authorized representative who has indicated his/her understanding and acceptance.   Dental advisory given  Plan Discussed with: CRNA  Anesthesia Plan Comments:         Anesthesia Quick Evaluation

## 2015-03-17 NOTE — Op Note (Signed)
Procedure: Upper GI endoscopy  Description of procedure: Upper GI endoscopy is performed at the completion of laparoscopic sleeve gastrectomy by Dr.  Ezzard StandingNewman.  The video endoscope was introduced into the upper esophagus and then passed to the EG junction at about 40 cm. The esophagus appeared normal. The gastric sleeve was entered. The sleeve was tensely distended with air while the outlet was obstructed under saline irrigation by the operating surgeon. There was no evidence of leak. The staple line was intact and without bleeding. The scope was advanced to the antrum and pylorus visualized. There was no stricture or twisting or mucosal abnormality, and particularly no narrowing noted at the incisura.  The pouch was then desufflated and the scope withdrawn.  Mariella SaaBenjamin T Gyan Cambre MD, FACS  03/17/2015, 1:58 PM

## 2015-03-17 NOTE — Progress Notes (Signed)
Utilization review completed.  L. J. Astrid Vides RN, BSN, CM 

## 2015-03-17 NOTE — Interval H&P Note (Signed)
History and Physical Interval Note:  03/17/2015 10:10 AM  Alexa Henderson  has presented today for surgery, with the diagnosis of Morbid Obesity  The various methods of treatment have been discussed with the patient and family.  Her husband is with her.  Her case has been delayed about one hour.   After consideration of risks, benefits and other options for treatment, the patient has consented to  Procedure(s): LAPAROSCOPIC GASTRIC SLEEVE RESECTION (N/A) as a surgical intervention .  The patient's history has been reviewed, patient examined, no change in status, stable for surgery.  I have reviewed the patient's chart and labs.  Questions were answered to the patient's satisfaction.     Odis Turck H

## 2015-03-17 NOTE — Op Note (Signed)
PATIENT:   Alexa Henderson DOB:   1970-09-18 MRN:   [id number]  DATE OF PROCEDURE: 03/17/2015                   FACILITY:  [facility]  OPERATIVE REPORT  PREOPERATIVE DIAGNOSIS:  Morbid obesity.  POSTOPERATIVE DIAGNOSIS:  Morbid obesity (weight 214, BMI of 36.8).  PROCEDURE:  Laparoscopic Sleeve gastrectomy (intraoperative upper endoscopy by Dr. Johna Sheriff)  SURGEON:  Sandria Bales. Ezzard Standing, MD  FIRST ASSISTANT:  B. Hoxworth, MD  ANESTHESIA:  General endotracheal.  Anesthesiologist: Lewie Loron, MD CRNA: Delphia Grates, CRNA; Elyn Peers, CRNA  General  ESTIMATED BLOOD LOSS:  Minimal.  LOCAL ANESTHESIA:  30 cc of 1/4% Marcaine  COMPLICATIONS:  None.  INDICATION FOR SURGERY:  Alexa Henderson is a 44 y.o. AA  female who sees Pcp Not In System Emmit Alexanders, MD, [facility]) as her primary care doctor.  She has completed our preoperative bariatric program and now comes for a laparoscopic sleeve gastrectomy.  The indications, potential complications of surgery were explained to the patient.  Potential complications of the surgery include, but are not limited to, bleeding, infection, DVT, open surgery, and long-term nutritional consequences.  OPERATIVE NOTE:  The patient taken to room #1 at [facility] where Ms. Bartosik underwent a general endotracheal anesthetic, supervised by Anesthesiologist: Lewie Loron, MD CRNA: Delphia Grates, CRNA; Elyn Peers, CRNA.  The patient was given 2 g of cefoxitin at the beginning of the procedure.  A time-out was held and surgical checklist run.  I accessed her abdominal cavity through the left upper quadrant with a 5 mm Optiview. I did an abdominal exploration.   Her omentum and bowel were unremarkable. The right and left lobes of the liver unremarkable. Gallbladder was normal. Her stomach was unremarkable.   I placed a total of 6 trocars. I placed a 5 mm left lateral trocar, a 5 mm left paramedian trocar (for the scope) (I upsized this during the case to fire one  load of the staplers) , a 12 mm right paramedian torcar, a 5 mm right subcostal trocar that I converted to a 15 mm to extract the stomach and 5 mm subxiphoid trocar for the liver retractor.  I started out taking down the greater curvature attachments of the stomach. I measured approximately 6 cm proximal from the pylorus and mobilized the greater curvature of the stomach with the Harmonic Scalpel. I took this dissection cranially around the greater curvature of her stomach to the angle of His and the left crus.   After I had mobilized the greater curvature of the stomach, I then passed the 36 French ViSiGi bougie which was used to suck the stomach up against the lesser curvature and placed into the antrum. During the staple firing,  I tried to give the ViSiGi a cuff at least about 1 cm. I tried to avoid narrowing the incisura. I used a total of 6 staple firings.  From antrum to the angle of His, I used 2 green, 1 gold and 3 blue Eschelon 60 mm Ethicon staplers. I did not use staple line reinforcement.   At each firing of the EndoGIA stapler, I inspected the stomach, anterior wall of the stomach, and underneath to make sure there was no compromise or impingement on to the ViSiGi bougie.   The staple line seemed linear without any corkscrewing of the stomach. Hemostasis was good. I did not use any reinforcement. She did have at least 2 areas of bleeding  which I used clips to clip on the new greater curvature of the stomach.  Because I thought we had a good staple line, I then had the ViSiGi was converted to insufflate the pouch. A new stomach pouch was placed under water. There was no bubbling or leak noted.   At this point, Dr. Johna SheriffHoxworth broke scrub and passed an upper endoscope down through the esophagus into the stomach pouch. The stomach was tubular. There was no narrowing of the stomach pouch or angulation. We were easily able to pass the endoscope into the antrum and again put air pressure on the staple  line. I irrigated the upper abdomen with saline. There was no bubbling or evidence of air leak. The mucosa looked viable. Dr. Johna SheriffHoxworth decompressed the stomach with the endoscopy.   I converted to right subcostal trocar to a 15 trocar and extracted the stomach remnant through this intact and sent this to Pathology. I then placed 10 cc of Tisseel along the new greater curvature staple line and covered the entire staple line with the Tisseel.  I aspirated out the saline that I had irrigated because I thought the staple line looked viable and complete. There was no evidence of leak. I did not leave a drain in place.   Then, I closed the trocar sites. I placed 2-0 Vicryl sutures at the 15-mm port site in the right upper quadrant. The other port sites seemed smaller not requiring sutures. I closed the skin at each site with a 4-0 Monocryl, painted each wound with LiquiBand.   The patient was transported to recovery room in good condition. Sponge and needle count were correct at the end of the case.    Ovidio Kinavid Erminia Mcnew, MD, [facility] Pager: [phone] Office phone:  [phone]

## 2015-03-17 NOTE — Transfer of Care (Signed)
Immediate Anesthesia Transfer of Care Note  Patient: Alexa Henderson  Procedure(s) Performed: Procedure(s): LAPAROSCOPIC GASTRIC SLEEVE RESECTION (N/A)  Patient Location: PACU  Anesthesia Type:General  Level of Consciousness: awake, alert , oriented and patient cooperative  Airway & Oxygen Therapy: Patient Spontanous Breathing and Patient connected to face mask oxygen  Post-op Assessment: Report given to RN and Post -op Vital signs reviewed and stable  Post vital signs: Reviewed and stable  Last Vitals:  Filed Vitals:   03/17/15 0641  BP: 107/58  Pulse: 78  Temp: 36.6 C  Resp: 18    Complications: No apparent anesthesia complications

## 2015-03-17 NOTE — Anesthesia Postprocedure Evaluation (Signed)
Anesthesia Post Note  Patient: Alexa Henderson  Procedure(s) Performed: Procedure(s) (LRB): LAPAROSCOPIC GASTRIC SLEEVE RESECTION (N/A)  Patient location during evaluation: PACU Anesthesia Type: General Level of consciousness: sedated and patient cooperative Pain management: pain level controlled Vital Signs Assessment: post-procedure vital signs reviewed and stable Respiratory status: spontaneous breathing Cardiovascular status: stable Anesthetic complications: no    Last Vitals:  Filed Vitals:   03/17/15 1330 03/17/15 1345  BP: 129/64 132/70  Pulse: 81 80  Temp:  36.7 C  Resp: 19 20    Last Pain:  Filed Vitals:   03/17/15 1347  PainSc: 3                  Lewie LoronJohn Angell Honse

## 2015-03-18 ENCOUNTER — Inpatient Hospital Stay (HOSPITAL_COMMUNITY): Payer: BLUE CROSS/BLUE SHIELD

## 2015-03-18 LAB — CBC WITH DIFFERENTIAL/PLATELET
Basophils Absolute: 0 10*3/uL (ref 0.0–0.1)
Basophils Relative: 0 %
EOS ABS: 0 10*3/uL (ref 0.0–0.7)
EOS PCT: 0 %
HCT: 31.5 % — ABNORMAL LOW (ref 36.0–46.0)
Hemoglobin: 11.1 g/dL — ABNORMAL LOW (ref 12.0–15.0)
LYMPHS ABS: 1.2 10*3/uL (ref 0.7–4.0)
LYMPHS PCT: 13 %
MCH: 30.1 pg (ref 26.0–34.0)
MCHC: 35.2 g/dL (ref 30.0–36.0)
MCV: 85.4 fL (ref 78.0–100.0)
MONOS PCT: 6 %
Monocytes Absolute: 0.6 10*3/uL (ref 0.1–1.0)
Neutro Abs: 7.8 10*3/uL — ABNORMAL HIGH (ref 1.7–7.7)
Neutrophils Relative %: 81 %
PLATELETS: 209 10*3/uL (ref 150–400)
RBC: 3.69 MIL/uL — AB (ref 3.87–5.11)
RDW: 12.9 % (ref 11.5–15.5)
WBC: 9.7 10*3/uL (ref 4.0–10.5)

## 2015-03-18 LAB — HEMOGLOBIN AND HEMATOCRIT, BLOOD
HCT: 30.9 % — ABNORMAL LOW (ref 36.0–46.0)
HEMOGLOBIN: 10.8 g/dL — AB (ref 12.0–15.0)

## 2015-03-18 MED ORDER — IOHEXOL 300 MG/ML  SOLN
50.0000 mL | Freq: Once | INTRAMUSCULAR | Status: AC | PRN
  Administered 2015-03-18: 50 mL via INTRAVENOUS

## 2015-03-18 NOTE — Progress Notes (Signed)
General Surgery Note  LOS: 1 day  POD -  1 Day Post-Op  Assessment/Plan: 1.  LAPAROSCOPIC GASTRIC SLEEVE RESECTION - 03/17/2015 - D. Cala Kruckenberg  For morbid obesity - weight - 214, BMI of 36.8  Did well with swallow.  Started on water.  2. 3 right knee operations (1999, 2001, and 2003) and 1 left knee operation (2006) She is not actively being followed by ortho 3. L5-S1 disk disese She has had injections, but is doing okay now 4. 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   None since then. 5. Breath Tek initially positive. 6. Recent C. Diff 7.  DVT prophylaxis - SQ Heparin   Active Problems:   Morbid obesity (HCC)  Subjective:  Doing well with minimal nausea.  Tolerating water.  Mother in room. Objective:   Filed Vitals:   03/18/15 0600 03/18/15 1007  BP: 126/60 110/64  Pulse: 73 58  Temp: 98.3 F (36.8 C) 98.2 F (36.8 C)  Resp: 16 16     Intake/Output from previous day:  12/27 0701 - 12/28 0700 In: 3133.3 [I.V.:3133.3] Out: 1700 [Urine:1650; Blood:50]  Intake/Output this shift:  Total I/O In: 120 [P.O.:120] Out: 700 [Urine:700]   Physical Exam:   General: Obese AA F who is alert and oriented.    HEENT: Normal. Pupils equal. .   Lungs: Clear   Abdomen: Soft    Wound: Clean.   Lab Results:    Recent Labs  03/17/15 1801 03/18/15 0550  WBC  --  9.7  HGB 11.2* 11.1*  HCT 32.1* 31.5*  PLT  --  209    BMET  No results for input(s): NA, K, CL, CO2, GLUCOSE, BUN, CREATININE, CALCIUM in the last 72 hours.  PT/INR  No results for input(s): LABPROT, INR in the last 72 hours.  ABG  No results for input(s): PHART, HCO3 in the last 72 hours.  Invalid input(s): PCO2, PO2   Studies/Results:  Dg Ugi W/water Sol Cm  03/18/2015  CLINICAL DATA:  Postop day 1 status post sleeve gastrectomy. EXAM: WATER SOLUBLE UPPER GI SERIES TECHNIQUE: Single-column upper GI series was performed using water soluble contrast. CONTRAST:  50mL  OMNIPAQUE IOHEXOL 300 MG/ML  SOLN COMPARISON:  07/29/2014 FLUOROSCOPY TIME:  Radiation Exposure Index (as provided by the fluoroscopic device): 88 microGy*m^2 FINDINGS: Initial KUB demonstrates expected clips from sleeve gastrectomy. Mild prominence of stool in the proximal colon. There is expected gastric narrowing along the sleeve gastrectomy site, the normal postoperative appearance for this procedure. Contrast does make its way pass the gastrectomy site into the distal stomach and duodenum. No leak or complicating feature observed. IMPRESSION: 1. Normal postoperative appearance for sleeve gastrectomy, without leak or complicating feature. Electronically Signed   By: Gaylyn RongWalter  Liebkemann M.D.   On: 03/18/2015 10:10     Anti-infectives:   Anti-infectives    Start     Dose/Rate Route Frequency Ordered Stop   03/17/15 0645  cefoTEtan in Dextrose 5% (CEFOTAN) IVPB 2 g     2 g Intravenous On call to O.R. 03/17/15 0646 03/17/15 1107      Ovidio Kinavid Freda Jaquith, MD, FACS Pager: [phone] [facility] Office: [phone] 03/18/2015

## 2015-03-18 NOTE — Progress Notes (Signed)
Patient alert and oriented, Post op day 1.  Provided support and encouragement.  Encouraged pulmonary toilet, ambulation and small sips of liquids.  All questions answered.  Will continue to monitor. 

## 2015-03-18 NOTE — Progress Notes (Signed)
Nutrition Education Note  Received consult for diet education per DROP protocol.   Discussed 2 week post op diet with pt. Emphasized that liquids must be non carbonated, non caffeinated, and sugar free. Fluid goals discussed. Pt to follow up with outpatient bariatric RD for further diet progression after 2 weeks. Multivitamins and minerals also reviewed. Teach back method used, pt expressed understanding, expect good compliance.   Diet: First 2 Weeks  You will see the dietitian about two (2) weeks after your surgery. The dietitian will increase the types of foods you can eat if you are handling liquids well:  If you have severe vomiting or nausea and cannot handle clear liquids lasting longer than 1 day, call your surgeon  Protein Shake  Drink at least 2 ounces of shake 5-6 times per day  Each serving of protein shakes (usually 8 - 12 ounces) should have a minimum of:  15 grams of protein  And no more than 5 grams of carbohydrate  Goal for protein each day:   Women = 60 grams per day  Protein powder may be added to fluids such as non-fat milk or Lactaid milk or Soy milk (limit to 35 grams added protein powder per serving)   Hydration  Slowly increase the amount of water and other clear liquids as tolerated (See Acceptable Fluids)  Slowly increase the amount of protein shake as tolerated  Sip fluids slowly and throughout the day  May use sugar substitutes in small amounts (no more than 6 - 8 packets per day; i.e. Splenda)   Fluid Goal  The first goal is to drink at least 8 ounces of protein shake/drink per day (or as directed by the nutritionist); some examples of protein shakes are [employer], [employer], EAS Edge HP, and Unjury. See handout from pre-op Bariatric Education Class:  Slowly increase the amount of protein shake you drink as tolerated  You may find it easier to slowly sip shakes throughout the day  It is important to get your proteins in first  Your fluid goal is  to drink 64 - 100 ounces of fluid daily  It may take a few weeks to build up to this  32 oz (or more) should be clear liquids  And  32 oz (or more) should be full liquids (see below for examples)  Liquids should not contain sugar, caffeine, or carbonation   Clear Liquids:  Water or Sugar-free flavored water (i.e. Fruit H2O, Propel)  Decaffeinated coffee or tea (sugar-free)  Crystal Lite, Wyler's Lite, Minute Maid Lite  Sugar-free Jell-O  Bouillon or broth  Sugar-free Popsicle: *Less than 20 calories each; Limit 1 per day   Full Liquids:  Protein Shakes/Drinks + 2 choices per day of other full liquids  Full liquids must be:  No More Than 12 grams of Carbs per serving  No More Than 3 grams of Fat per serving  Strained low-fat cream soup  Non-Fat milk  Fat-free Lactaid Milk  Sugar-free yogurt (Dannon Lite & Fit, Greek yogurt)   Pt states that she saw outpatient RD x6 months as well as attending pre-op group class prior to her surgery. She states that nurse educator also visited earlier today to review information with her. Pt has already purchased supplements for home use and has had a program setup with Dr. Ezzard Standing whereby she will have her supplements shipped to her house every 30 days. Pt also purchased Premier Protein, Unjury, and Light and Fit yogurt for when she returns home.  Pt does not have any questions or concerns at this time.   Trenton GammonJessica Harleigh Civello, RD, LDN Inpatient Clinical Dietitian Pager # [phone] After hours/weekend pager # [phone]

## 2015-03-19 LAB — CBC WITH DIFFERENTIAL/PLATELET
Basophils Absolute: 0 10*3/uL (ref 0.0–0.1)
Basophils Relative: 0 %
EOS PCT: 0 %
Eosinophils Absolute: 0 10*3/uL (ref 0.0–0.7)
HCT: 28.2 % — ABNORMAL LOW (ref 36.0–46.0)
Hemoglobin: 9.8 g/dL — ABNORMAL LOW (ref 12.0–15.0)
LYMPHS ABS: 3 10*3/uL (ref 0.7–4.0)
LYMPHS PCT: 30 %
MCH: 30 pg (ref 26.0–34.0)
MCHC: 34.8 g/dL (ref 30.0–36.0)
MCV: 86.2 fL (ref 78.0–100.0)
MONO ABS: 0.8 10*3/uL (ref 0.1–1.0)
Monocytes Relative: 8 %
Neutro Abs: 6.3 10*3/uL (ref 1.7–7.7)
Neutrophils Relative %: 62 %
PLATELETS: 192 10*3/uL (ref 150–400)
RBC: 3.27 MIL/uL — AB (ref 3.87–5.11)
RDW: 13.1 % (ref 11.5–15.5)
WBC: 10.1 10*3/uL (ref 4.0–10.5)

## 2015-03-19 NOTE — Progress Notes (Signed)
Patient alert and oriented, pain is controlled. Patient is tolerating fluids,  advanced to protein shake today, patient tolerated well.  Reviewed Gastric sleeve discharge instructions with patient and patient is able to articulate understanding.  Provided information on BELT program, Support Group and [facility] outpatient pharmacy. All questions answered, will continue to monitor.  

## 2015-03-19 NOTE — Discharge Summary (Signed)
Physician Discharge Summary  Patient ID:  Alexa RudRobin A Weichert  MRN: [id number]  DOB/AGE: 45/03/1970 45 y.o.  Admit date: 03/17/2015 Discharge date: 03/19/2015  Discharge Diagnoses:  1.  Morbid obesity - weight - 214, BMI of 36.8  2. 3 right knee operations (1999, 2001, and 2003) and 1 left knee operation (2006) She is not actively being followed by ortho 3. L5-S1 disk disese She has had injections, but is doing okay now 4. 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  None since then. 5. Breath Tek initially positive. 6. Recent C. Diff   Active Problems:   Morbid obesity (HCC)   Operation: Procedure(s): LAPAROSCOPIC GASTRIC SLEEVE RESECTION on 03/17/2015 - D. Ezzard StandingNewman  Discharged Condition: good  Hospital Course: Alexa Henderson is an 44 y.o. female whose primary care physician is Pcp Not In System and who was admitted 03/17/2015 with a chief complaint of morbid obesity.   She was brought to the operating room on 03/17/2015 and underwent  LAPAROSCOPIC GASTRIC SLEEVE RESECTION.   Her swallow the first post op day looked good. She is now taking her protein shakes, has no nausea, and is ready to go home.  The discharge instructions were reviewed with the patient.  Consults: None  Significant Diagnostic Studies: Results for orders placed or performed during the hospital encounter of 03/17/15  Hemoglobin and hematocrit, blood  Result Value Ref Range   Hemoglobin 11.2 (L) 12.0 - 15.0 g/dL   HCT 21.332.1 (L) 08.636.0 - 57.846.0 %  CBC WITH DIFFERENTIAL  Result Value Ref Range   WBC 9.7 4.0 - 10.5 K/uL   RBC 3.69 (L) 3.87 - 5.11 MIL/uL   Hemoglobin 11.1 (L) 12.0 - 15.0 g/dL   HCT 46.931.5 (L) 62.936.0 - 52.846.0 %   MCV 85.4 78.0 - 100.0 fL   MCH 30.1 26.0 - 34.0 pg   MCHC 35.2 30.0 - 36.0 g/dL   RDW 41.312.9 24.411.5 - 01.015.5 %   Platelets 209 150 - 400 K/uL   Neutrophils Relative % 81 %   Neutro Abs 7.8 (H) 1.7 - 7.7 K/uL   Lymphocytes Relative 13 %   Lymphs  Abs 1.2 0.7 - 4.0 K/uL   Monocytes Relative 6 %   Monocytes Absolute 0.6 0.1 - 1.0 K/uL   Eosinophils Relative 0 %   Eosinophils Absolute 0.0 0.0 - 0.7 K/uL   Basophils Relative 0 %   Basophils Absolute 0.0 0.0 - 0.1 K/uL  Urinalysis, Routine w reflex microscopic (not at [facility])  Result Value Ref Range   Color, Urine YELLOW YELLOW   APPearance CLEAR CLEAR   Specific Gravity, Urine 1.015 1.005 - 1.030   pH 5.5 5.0 - 8.0   Glucose, UA NEGATIVE NEGATIVE mg/dL   Hgb urine dipstick MODERATE (A) NEGATIVE   Bilirubin Urine NEGATIVE NEGATIVE   Ketones, ur 15 (A) NEGATIVE mg/dL   Protein, ur NEGATIVE NEGATIVE mg/dL   Nitrite NEGATIVE NEGATIVE   Leukocytes, UA NEGATIVE NEGATIVE  Urine microscopic-add on  Result Value Ref Range   Squamous Epithelial / LPF 0-5 (A) NONE SEEN   WBC, UA 0-5 0 - 5 WBC/hpf   RBC / HPF 0-5 0 - 5 RBC/hpf   Bacteria, UA NONE SEEN NONE SEEN   Urine-Other MUCOUS PRESENT   Hemoglobin and hematocrit, blood  Result Value Ref Range   Hemoglobin 10.8 (L) 12.0 - 15.0 g/dL   HCT 27.230.9 (L) 53.636.0 - 64.446.0 %  CBC with Differential  Result Value Ref Range  WBC 10.1 4.0 - 10.5 K/uL   RBC 3.27 (L) 3.87 - 5.11 MIL/uL   Hemoglobin 9.8 (L) 12.0 - 15.0 g/dL   HCT 16.1 (L) 09.6 - 04.5 %   MCV 86.2 78.0 - 100.0 fL   MCH 30.0 26.0 - 34.0 pg   MCHC 34.8 30.0 - 36.0 g/dL   RDW 40.9 81.1 - 91.4 %   Platelets 192 150 - 400 K/uL   Neutrophils Relative % 62 %   Neutro Abs 6.3 1.7 - 7.7 K/uL   Lymphocytes Relative 30 %   Lymphs Abs 3.0 0.7 - 4.0 K/uL   Monocytes Relative 8 %   Monocytes Absolute 0.8 0.1 - 1.0 K/uL   Eosinophils Relative 0 %   Eosinophils Absolute 0.0 0.0 - 0.7 K/uL   Basophils Relative 0 %   Basophils Absolute 0.0 0.0 - 0.1 K/uL    Dg Ugi W/water Sol Cm  03/18/2015  CLINICAL DATA:  Postop day 1 status post sleeve gastrectomy. EXAM: WATER SOLUBLE UPPER GI SERIES TECHNIQUE: Single-column upper GI series was performed using water soluble contrast. CONTRAST:  50mL  OMNIPAQUE IOHEXOL 300 MG/ML  SOLN COMPARISON:  07/29/2014 FLUOROSCOPY TIME:  Radiation Exposure Index (as provided by the fluoroscopic device): 88 microGy*m^2 FINDINGS: Initial KUB demonstrates expected clips from sleeve gastrectomy. Mild prominence of stool in the proximal colon. There is expected gastric narrowing along the sleeve gastrectomy site, the normal postoperative appearance for this procedure. Contrast does make its way pass the gastrectomy site into the distal stomach and duodenum. No leak or complicating feature observed. IMPRESSION: 1. Normal postoperative appearance for sleeve gastrectomy, without leak or complicating feature. Electronically Signed   By: Gaylyn Rong M.D.   On: 03/18/2015 10:10    Discharge Exam:  Filed Vitals:   03/19/15 0607 03/19/15 1013  BP: 120/66 122/60  Pulse: 71 68  Temp: 98.9 F (37.2 C) 98.2 F (36.8 C)  Resp: 16 16    General: Obese AA F who is alert.  Lungs: Clear to auscultation and symmetric breath sounds. Heart:  RRR. No murmur or rub. Abdomen: Soft. No hernia. Normal bowel sounds.  Incisions look good.  Discharge Medications:     Medication List    STOP taking these medications        vancomycin 125 MG capsule  Commonly known as:  VANCOCIN      TAKE these medications        dicyclomine 20 MG tablet  Commonly known as:  BENTYL  Take 20 mg by mouth every 6 (six) hours as needed. For spasms        Disposition: 01-Home or Self Care      Discharge Instructions    Ambulate hourly while awake    Complete by:  As directed      Call MD for:  difficulty breathing, headache or visual disturbances    Complete by:  As directed      Call MD for:  persistant dizziness or light-headedness    Complete by:  As directed      Call MD for:  persistant nausea and vomiting    Complete by:  As directed      Call MD for:  redness, tenderness, or signs of infection (pain, swelling, redness, odor or green/yellow discharge around incision  site)    Complete by:  As directed      Call MD for:  severe uncontrolled pain    Complete by:  As directed      Call  MD for:  temperature >101 F    Complete by:  As directed      Diet bariatric full liquid    Complete by:  As directed      Incentive spirometry    Complete by:  As directed   Perform hourly while awake           Follow-up Information    Follow up with [facility] H, MD. Go on 04/02/2015.   Specialty:  General Surgery   Why:  For Post-Op Check at 11:30   Contact information:   [address] [place] [address] [phone]       Follow up with [facility] H, MD. Go on 04/23/2015.   Specialty:  General Surgery   Why:  For Post-Op Check at 2:45   Contact information:   [address] [place] [address] [phone]       Return to work on:  Approximately one month - 04/17/2015  Activity:  Driving - May drive in two or three days if doing well.   Lifting - No lifting more than 15 pounds for 7 days, then no limit  Wound Care:   May shower  Diet:  Post sleeve diet.  Drink plenty of water.  Follow up appointment:  Call Dr. Allene Pyo office [facility]) at [phone] for an appointment in 2 weeks.  Medications and dosages:  Resume your home medications.   Signed: Ovidio Kin, M.D., [facility] Surgery Office:  [phone]  03/19/2015, 10:20 AM

## 2015-03-19 NOTE — Discharge Instructions (Signed)
° ° ° °GASTRIC BYPASS/SLEEVE ° Home Care Instructions ° ° These instructions are to help you care for yourself when you go home. ° °Call: If you have any problems. °• Call [phone] and ask for the surgeon on call °• If you need immediate assistance come to the ER at [place]. Tell the ER staff you are a new post-op gastric bypass or gastric sleeve patient  °Signs and symptoms to report: • Severe  vomiting or nausea °o If you cannot handle clear liquids for longer than 1 day, call your surgeon °• Abdominal pain which does not get better after taking your pain medication °• Fever greater than 100.4°  F and chills °• Heart rate over 100 beats a minute °• Trouble breathing °• Chest pain °• Redness,  swelling, drainage, or foul odor at incision (surgical) sites °• If your incisions open or pull apart °• Swelling or pain in calf (lower leg) °• Diarrhea (Loose bowel movements that happen often), frequent watery, uncontrolled bowel movements °• Constipation, (no bowel movements for 3 days) if this happens: °o Take Milk of Magnesia, 2 tablespoons by mouth, 3 times a day for 2 days if needed °o Stop taking Milk of Magnesia once you have had a bowel movement °o Call your doctor if constipation continues °Or °o Take Miralax  (instead of Milk of Magnesia) following the label instructions °o Stop taking Miralax once you have had a bowel movement °o Call your doctor if constipation continues °• Anything you think is “abnormal for you” °  °Normal side effects after surgery: • Unable to sleep at night or unable to concentrate °• Irritability °• Being tearful (crying) or depressed ° °These are common complaints, possibly related to your anesthesia, stress of surgery, and change in lifestyle, that usually go away a few weeks after surgery. If these feelings continue, call your medical doctor.  °Wound Care: You may have surgical glue, steri-strips, or staples over your incisions after surgery °• Surgical glue: Looks like clear  film over your incisions and will wear off a little at a time °• Steri-strips: Adhesive strips of tape over your incisions. You may notice a yellowish color on skin under the steri-strips. This is used to make the steri-strips stick better. Do not pull the steri-strips off - let them fall off °• Staples: Staples may be removed before you leave the hospital °o If you go home with staples, call [facility] for an appointment with your surgeon’s nurse to have staples removed 10 days after surgery, [phone] °• Showering: You may shower two (2) days after your surgery unless your surgeon tells you differently °o Wash gently around incisions with warm soapy water, rinse well, and gently pat dry °o If you have a drain (tube from your incision), you may need someone to hold this while you shower °o No tub baths until staples are removed and incisions are healed °  °Medications: • Medications should be liquid or crushed if larger than the size of a dime °• Extended release pills (medication that releases a little bit at a time through the  day) should not be crushed °• Depending on the size and number of medications you take, you may need to space (take a few throughout the day)/change the time you take your medications so that you do not over-fill your pouch (smaller stomach) °• Make sure you follow-up with you primary care physician to make medication changes needed during rapid weight loss and life -style changes °•   If you have diabetes, follow up with your doctor that orders your diabetes medication(s) within one week after surgery and check your blood sugar regularly ° °• Do not drive while taking narcotics (pain medications) ° °• Do not take acetaminophen (Tylenol) and Roxicet or Lortab Elixir at the same time since these pain medications contain acetaminophen °  °Diet:  °First 2 Weeks You will see the nutritionist about two (2) weeks after your surgery. The nutritionist will increase the types of  foods you can eat if you are handling liquids well: °• If you have severe vomiting or nausea and cannot handle clear liquids lasting longer than 1 day call your surgeon °Protein Shake °• Drink at least 2 ounces of shake 5-6 times per day °• Each serving of protein shakes (usually 8-12 ounces) should have a minimum of: °o 15 grams of protein °o And no more than 5 grams of carbohydrate °• Goal for protein each day: °o Men = 80 grams per day °o Women = 60 grams per day °  ° • Protein powder may be added to fluids such as non-fat milk or Lactaid milk or Soy milk (limit to 35 grams added protein powder per serving) ° °Hydration °• Slowly increase the amount of water and other clear liquids as tolerated (See Acceptable Fluids) °• Slowly increase the amount of protein shake as tolerated °• Sip fluids slowly and throughout the day °• May use sugar substitutes in small amounts (no more than 6-8 packets per day; i.e. Splenda) ° °Fluid Goal °• The first goal is to drink at least 8 ounces of protein shake/drink per day (or as directed by the nutritionist); some examples of protein shakes are Syntrax Nectar, Adkins Advantage, EAS Edge HP, and Unjury. - See handout from pre-op Bariatric Education Class: °o Slowly increase the amount of protein shake you drink as tolerated °o You may find it easier to slowly sip shakes throughout the day °o It is important to get your proteins in first °• Your fluid goal is to drink 64-100 ounces of fluid daily °o It may take a few weeks to build up to this  °• 32 oz. (or more) should be clear liquids °And °• 32 oz. (or more) should be full liquids (see below for examples) °• Liquids should not contain sugar, caffeine, or carbonation ° °Clear Liquids: °• Water of Sugar-free flavored water (i.e. Fruit H²O, Propel) °• Decaffeinated coffee or tea (sugar-free) °• Crystal lite, Wyler’s Lite, Minute Maid Lite °• Sugar-free Jell-O °• Bouillon or broth °• Sugar-free Popsicle:    - Less than 20 calories  each; Limit 1 per day ° °Full Liquids: °                  Protein Shakes/Drinks + 2 choices per day of other full liquids °• Full liquids must be: °o No More Than 12 grams of Carbs per serving °o No More Than 3 grams of Fat per serving °• Strained low-fat cream soup °• Non-Fat milk °• Fat-free Lactaid Milk °• Sugar-free yogurt (Dannon Lite & Fit, Greek yogurt) ° °  °Vitamins and Minerals • Start 1 day after surgery unless otherwise directed by your surgeon °• 2 Chewable Multivitamin / Multimineral Supplement with iron (i.e. Centrum for Adults) °• Vitamin B-12, 350-500 micrograms sub-lingual (place tablet under the tongue) each day °• Chewable Calcium Citrate with Vitamin D-3 °(Example: 3 Chewable Calcium  Plus 600 with Vitamin D-3) °o Take 500 mg three (3) times a day for   a total of 1500 mg each day °o Do not take all 3 doses of calcium at one time as it may cause constipation, and you can only absorb 500 mg at a time °o Do not mix multivitamins containing iron with calcium supplements;  take 2 hours apart °o Do not substitute Tums (calcium carbonate) for your calcium °• Menstruating women and those at risk for anemia ( a blood disease that causes weakness) may need extra iron °o Talk to your doctor to see if you need more iron °• If you need extra iron: Total daily Iron recommendation (including Vitamins) is 50 to 100 mg Iron/day °• Do not stop taking or change any vitamins or minerals until you talk to your nutritionist or surgeon °• Your nutritionist and/or surgeon must approve all vitamin and mineral supplements °  °Activity and Exercise: It is important to continue walking at home. Limit your physical activity as instructed by your doctor. During this time, use these guidelines: °• Do not lift anything greater than ten  (10) pounds for at least two (2) weeks °• Do not go back to work or drive until your surgeon says you can °• You may have sex when you feel comfortable °o It is VERY important for female  patients to use a reliable birth control method; fertility often increase after surgery °o Do not get pregnant for at least 18 months °• Start exercising as soon as your doctor tells you that you can °o Make sure your doctor approves any physical activity °• Start with a simple walking program °• Walk 5-15 minutes each day, 7 days per week °• Slowly increase until you are walking 30-45 minutes per day °• Consider joining our BELT program. [phone] or email [email or web address] °  °Special Instructions Things to remember: °• Free counseling is available for you and your family through collaboration between [place] and [employer]. Please call [phone] and leave a message °• Use your CPAP when sleeping if this applies to you °• Consider buying a medical alert bracelet that says you had lap-band surgery °  °  You will likely have your first fill (fluid added to your band) 6 - 8 weeks after surgery °• [facility] has a free Bariatric Surgery Support Group that meets monthly, the 3rd Thursday, 6pm. [facility] Classrooms. You can see classes online at [email or web address] °• It is very important to keep all follow up appointments with your surgeon, nutritionist, primary care physician, and behavioral health practitioner °o After the first year, please follow up with your bariatric surgeon and nutritionist at least once a year in order to maintain best weight loss results °      °             [facility]:  [phone] ° °             [facility]: [phone] ° °             Bariatric Nurse Coordinator: [phone]  °Gastric Bypass/Sleeve Home Care Instructions  Rev. 04/2012    ° °                                                    Reviewed and Endorsed °                                                     by [facility] Patient Education Committee, Jan, 2014  [facility] - DISCHARGE INSTRUCTIONS TO PATIENT  Return to  work on:  Approximately one month - 04/17/2015  Activity:  Driving - May drive in two or three days if doing well.   Lifting - No lifting more than 15 pounds for 7 days, then no limit  Wound Care:   May shower  Diet:  Post sleeve diet.  Drink plenty of water.  Follow up appointment:  Call Dr. Allene PyoNewman's office [facility] at [phone] for an appointment in 2 weeks.  Medications and dosages:  Resume your home medications.  Call [name] or his office  [phone]) if you have:  Temperature greater than 100.4,  Persistent nausea and vomiting,  Severe uncontrolled pain,  Redness, tenderness, or signs of infection (pain, swelling, redness, odor or green/yellow discharge around the site),  Difficulty breathing, headache or visual disturbances,  Any other questions or concerns you may have after discharge.  In an emergency, call 911 or go to an Emergency Department at a nearby hospital.

## 2015-03-19 NOTE — Progress Notes (Signed)
Assessment unchanged. Pt verbalized understanding of dc instructions including follow up care and when to call the doctor. Skip EstimableLaurie Deaton, RN completed bariatric teaching. Pt tolerating shakes. Discharged via wc to front entrance to meet awaiting vehicle to carry home. Accompanied by NT and husband.

## 2015-03-24 ENCOUNTER — Telehealth (HOSPITAL_COMMUNITY): Payer: Self-pay

## 2015-03-24 NOTE — Telephone Encounter (Signed)

## 2015-03-31 ENCOUNTER — Encounter: Payer: BLUE CROSS/BLUE SHIELD | Attending: Surgery

## 2015-03-31 DIAGNOSIS — Z6837 Body mass index (BMI) 37.0-37.9, adult: Secondary | ICD-10-CM | POA: Insufficient documentation

## 2015-03-31 DIAGNOSIS — Z713 Dietary counseling and surveillance: Secondary | ICD-10-CM | POA: Insufficient documentation

## 2015-03-31 NOTE — Progress Notes (Signed)
Bariatric Class:  Appt start time: 1530 end time:  1630.  2 Week Post-Operative Nutrition Class  Patient was seen on 03/31/2015 for Post[facility]ative [facility]    Surgery date: 03/17/15 Surgery type: sleeve gastrectomy Start weight at [facility]: 213 lbs on 07/16/14 Weight today: 199 lbs Weight change: 17 lbs  TANITA  BODY COMP RESULTS  03/02/15 03/31/15   BMI (kg/m^2) 38.3 35.3   Fat Mass (lbs) 109 97.5   Fat Free Mass (lbs) 107 101.5   Total Body Water (lbs) 78.5 74.5    The following the learning objectives were met by the patient during this course:  Identifies Phase 3A (Soft, High Proteins) Dietary Goals and will begin from 2 weeks post-operatively to 2 months post-operatively  Identifies appropriate sources of fluids and proteins   States protein recommendations and appropriate sources post-operatively  Identifies the need for appropriate texture modifications, mastication, and bite sizes when consuming solids  Identifies appropriate multivitamin and calcium sources post-operatively  Describes the need for physical activity post-operatively and will follow MD recommendations  States when to call healthcare provider regarding medication questions or post-operative complications  Handouts given during class include:  Phase 3A: Soft, High Protein Diet Handout  Follow-Up Plan: Patient will follow-up at [facility] in 4 weeks for 6 week post-op nutrition visit for diet advancement per MD.

## 2015-05-19 ENCOUNTER — Encounter: Payer: Self-pay | Admitting: Dietician

## 2015-05-19 ENCOUNTER — Encounter: Payer: BLUE CROSS/BLUE SHIELD | Attending: Surgery | Admitting: Dietician

## 2015-05-19 DIAGNOSIS — Z713 Dietary counseling and surveillance: Secondary | ICD-10-CM | POA: Insufficient documentation

## 2015-05-19 DIAGNOSIS — Z6837 Body mass index (BMI) 37.0-37.9, adult: Secondary | ICD-10-CM | POA: Diagnosis not present

## 2015-05-19 NOTE — Patient Instructions (Signed)
Goals:  Follow Phase 3B: High Protein + Non-Starchy Vegetables  Eat 3-6 small meals/snacks, every 3-5 hrs  Increase lean protein foods to meet 60g goal  Increase fluid intake to 64oz +  Avoid drinking 15 minutes before, during and 30 minutes after eating  Aim for >30 min of physical activity daily  Reduce fruit in smoothie to 1/2 cup or less  If you start to see your weight loss slow, reduce Special K protein bites  Surgery date: 03/17/15 Surgery type: sleeve gastrectomy Start weight at [facility]: 213 lbs on 07/16/14 (highest weight 216 lbs) Weight today: 184.5 lbs Weight change: 14.5 lbs Total weight lost: 28.5 lbs Weight lost goal: 145 lbs   TANITA  BODY COMP RESULTS  03/02/15 03/31/15 05/19/15   BMI (kg/m^2) 38.3 35.3 32.7   Fat Mass (lbs) 109 97.5 93   Fat Free Mass (lbs) 107 101.5 91.5   Total Body Water (lbs) 78.5 74.5 67

## 2015-05-19 NOTE — Progress Notes (Signed)
  Follow-up visit:  8 Weeks Post-Operative Sleeve gastrectomy Surgery  Medical Nutrition Therapy:  Appt start time: 1100 end time:  1130.  Primary concerns today: Post-operative Bariatric Surgery Nutrition Management. Was very sick with pneumonia recently. Tolerating all recommended foods. Protein shakes are too sweet.   Surgery date: 03/17/15 Surgery type: sleeve gastrectomy Start weight at [facility]: 213 lbs on 07/16/14 (highest weight 216 lbs) Weight today: 184.5 lbs Weight change: 14.5 lbs Total weight lost: 28.5 lbs Weight lost goal: 145 lbs  TANITA  BODY COMP RESULTS  03/02/15 03/31/15 05/19/15   BMI (kg/m^2) 38.3 35.3 32.7   Fat Mass (lbs) 109 97.5 93   Fat Free Mass (lbs) 107 101.5 91.5   Total Body Water (lbs) 78.5 74.5 67    Preferred Learning Style:   No preference indicated   Learning Readiness:   Ready  24-hr recall: B (8:30AM-throughout morning): [place] yogurt OR smoothie with plain yogurt, 1 cup of berries and banana, protein powder (12-29g) Snk (AM):   L (PM): 1.5 oz salmon (10g) Snk (PM):  4-5 cubes cheese (10-14g) D (PM): 1.5 oz salmon (10g) Snk (PM):   Fluid intake: 51-64 oz water Estimated total protein intake: 60 grams per day per patient  Medications: none Supplementation: taking  Using straws: no Drinking while eating: no Hair loss: a little bit Carbonated beverages: no N/V/D/C: sometimes constipation, occasionally takes Milk of Magnesia Dumping syndrome: none  Recent physical activity:  3-4 days a week (zumba, walking, 30-minute circuit at gym+20 minutes on elliptical or treadmill)  Progress Towards Goal(s):  In progress.  Handouts given during visit include:  Phase 3B lean protein + non starchy vegetables   Nutritional Diagnosis:  [place]-3.3 Overweight/obesity related to past poor dietary habits and physical inactivity as evidenced by patient w/ recent sleeve gastrectomy surgery following dietary guidelines for continued weight loss.     Intervention:  Nutrition counseling/diet advancement.  Teaching Method Utilized:  Visual Auditory Hands on  Barriers to learning/adherence to lifestyle change: none  Demonstrated degree of understanding via:  Teach Back   Monitoring/Evaluation:  Dietary intake, exercise, and body weight. Follow up in 6 weeks for 3.5 month post-op visit.

## 2015-06-30 ENCOUNTER — Encounter: Payer: BLUE CROSS/BLUE SHIELD | Attending: Surgery | Admitting: Dietician

## 2015-06-30 DIAGNOSIS — Z713 Dietary counseling and surveillance: Secondary | ICD-10-CM | POA: Diagnosis not present

## 2015-06-30 DIAGNOSIS — Z6837 Body mass index (BMI) 37.0-37.9, adult: Secondary | ICD-10-CM | POA: Insufficient documentation

## 2015-06-30 NOTE — Progress Notes (Signed)
  Follow-up visit:  3.5 Months Post-Operative Sleeve gastrectomy Surgery  Medical Nutrition Therapy:  Appt start time: 335 end time:  405  Primary concerns today: Post-operative Bariatric Surgery Nutrition Management. Alexa Henderson returns having lost another 10 pounds in the last 6 weeks. Feeling very well; excited about weight loss. Doing a high-intensity bootcamp class 4x a week. Noticing some weight loss plateaus. Not having any cravings. Tried some potato and had a stuck feeling so she has not tried this again. Weighs herself 1x a week. Seems to have a healthy outlook about her lifestyle and weight loss.   Surgery date: 03/17/15 Surgery type: sleeve gastrectomy [facility] lbs on 07/16/14 (highest weight 216 lbs) Weight today: 174 lbs Weight change: 10.5 lbs Total weight lost: 42 lbs Weight lost goal: 145 lbs  TANITA  BODY COMP RESULTS  03/02/15 03/31/15 05/19/15 06/30/15   BMI (kg/m^2) 38.3 35.3 32.7 30.8   Fat Mass (lbs) 109 97.5 93 69.5   Fat Free Mass (lbs) 107 101.5 91.5 104.5   Total Body Water (lbs) 78.5 74.5 67 76.5    Preferred Learning Style:   No preference indicated   Learning Readiness:   Ready  24-hr recall: B (8:30AM-throughout morning): 1 egg OR protein shake Snk (AM):  Protein shake if she just had an egg for breakfast L (PM): 2 oz salmon or shrimp with green beans (14g) Snk (PM):  Special K protein bites or sometimes fruit D (PM): 2 oz tilapia with steamed broccoli (14g) Snk (PM):   Fluid intake: 51-64 oz water Estimated total protein intake: 60 grams per day per patient  Medications: none Supplementation: taking  Using straws: no Drinking while eating: no Hair loss: resolving, taking Biotin and Keranique Carbonated beverages: no N/V/D/C: sometimes constipation, occasionally takes Milk of Magnesia Dumping syndrome: none  Recent physical activity:  Bootcamp 4 days a week for an hour + walking 2 miles most days  Progress Towards Goal(s):  In  progress.  Handouts given during visit include:  none   Nutritional Diagnosis:  [employer]-3.3 Overweight/obesity related to past poor dietary habits and physical inactivity as evidenced by patient w/ recent sleeve gastrectomy surgery following dietary guidelines for continued weight loss.    Intervention:  Nutrition counseling/diet advancement.  Teaching Method Utilized:  Visual Auditory Hands on  Barriers to learning/adherence to lifestyle change: none  Demonstrated degree of understanding via:  Teach Back   Monitoring/Evaluation:  Dietary intake, exercise, and body weight. Follow up in 2 months for 5.5 month post-op visit.

## 2015-06-30 NOTE — Patient Instructions (Addendum)
Goals:  Follow Phase 3B: High Protein + Non-Starchy Vegetables  Eat 3-6 small meals/snacks, every 3-5 hrs  Increase lean protein foods to meet 60g goal  Increase fluid intake to 64oz +  Avoid drinking 15 minutes before, during and 30 minutes after eating  Aim for >30 min of physical activity daily  Reduce fruit in smoothie to 1/2 cup or less  If you start to see your weight loss slow, reduce Special K protein bites  Try [facility] protein bar or [facility] bar  Surgery date: 03/17/15 Surgery type: sleeve gastrectomy [facility] lbs on 07/16/14 (highest weight 216 lbs) Weight today: 174 lbs Weight change: 10.5 lbs Total weight lost: 42 lbs Weight lost goal: 145 lbs  TANITA  BODY COMP RESULTS  03/02/15 03/31/15 05/19/15 06/30/15   BMI (kg/m^2) 38.3 35.3 32.7 30.8   Fat Mass (lbs) 109 97.5 93 69.5   Fat Free Mass (lbs) 107 101.5 91.5 104.5   Total Body Water (lbs) 78.5 74.5 67 76.5

## 2015-07-01 ENCOUNTER — Encounter: Payer: Self-pay | Admitting: Dietician

## 2015-08-25 ENCOUNTER — Encounter: Payer: Self-pay | Admitting: Dietician

## 2015-08-25 ENCOUNTER — Encounter: Payer: BLUE CROSS/BLUE SHIELD | Attending: Surgery | Admitting: Dietician

## 2015-08-25 DIAGNOSIS — Z713 Dietary counseling and surveillance: Secondary | ICD-10-CM | POA: Diagnosis not present

## 2015-08-25 DIAGNOSIS — Z6837 Body mass index (BMI) 37.0-37.9, adult: Secondary | ICD-10-CM | POA: Insufficient documentation

## 2015-08-25 NOTE — Progress Notes (Signed)
  Follow-up visit:  5.5 Months Post-Operative Sleeve gastrectomy Surgery  Medical Nutrition Therapy:  Appt start time: 205 end time:  230  Primary concerns today: Post-operative Bariatric Surgery Nutrition Management. Alexa Henderson returns having lost another 6.4 pounds in the last 2 months. Following up with [name] in August, hoping to be close to her goal weight at that time. Working on a balance with diet and exercise. She continues to exercise daily and avoid starches. Plans to start another 30-day bootcamp in July. Keeps protein bites on hand to have between meals.   Surgery date: 03/17/15 Surgery type: sleeve gastrectomy [facility] lbs on 07/16/14 (highest weight 216 lbs) Weight today: 167.6 lbs Weight change: 6.4 lbs Total weight lost: 48.4 lbs Weight lost goal: 145-150 lbs  TANITA  BODY COMP RESULTS  03/02/15 03/31/15 05/19/15 06/30/15 08/25/15   BMI (kg/m^2) 38.3 35.3 32.7 30.8 29.7   Fat Mass (lbs) 109 97.5 93 69.5 64.6   Fat Free Mass (lbs) 107 101.5 91.5 104.5 103   Total Body Water (lbs) 78.5 74.5 67 76.5 72.8    Preferred Learning Style:   No preference indicated   Learning Readiness:   Ready  24-hr recall: B (8:30AM-throughout morning): 1 egg and 1/2 protein shake (22g) Snk (AM):   L (PM): P3 pack (14g) Snk (PM):  Rest of protein shake (15g) D (PM): 2 oz salmon with steamed broccoli (14g) Snk (PM):   Fluid intake: 51-64 oz water Estimated total protein intake: 60 grams per day per patient  Medications: none Supplementation: taking  Using straws: no Drinking while eating: no Hair loss: noticing regrowth Carbonated beverages: no N/V/D/C: sometimes constipation, occasionally takes Milk of Magnesia Dumping syndrome: none  Recent physical activity:  Running 2+ miles and strength training 6 days a week   Progress Towards Goal(s):  In progress.  Handouts given during visit include:  none   Nutritional Diagnosis:  [place]-3.3 Overweight/obesity  related to past poor dietary habits and physical inactivity as evidenced by patient w/ recent sleeve gastrectomy surgery following dietary guidelines for continued weight loss.    Intervention:  Nutrition counseling/diet advancement.  Teaching Method Utilized:  Visual Auditory Hands on  Barriers to learning/adherence to lifestyle change: none  Demonstrated degree of understanding via:  Teach Back   Monitoring/Evaluation:  Dietary intake, exercise, and body weight. Follow up in 6 months for 12 month post-op visit.

## 2015-08-25 NOTE — Patient Instructions (Addendum)
Goals:  Follow Phase 3B: High Protein + Non-Starchy Vegetables  Eat 3-6 small meals/snacks, every 3-5 hrs  Increase lean protein foods to meet 60g goal  Increase fluid intake to 64oz +  Avoid drinking 15 minutes before, during and 30 minutes after eating  Aim for >30 min of physical activity daily  Reduce fruit in smoothie to 1/2 cup or less  If you start to see your weight loss slow, reduce Special K protein bites  Try [facility] protein bar or [facility] bar  Surgery date: 03/17/15 Surgery type: sleeve gastrectomy [facility] lbs on 07/16/14 (highest weight 216 lbs) Weight today: 167.6 lbs Weight change: 6.4 lbs Total weight lost: 48.4 lbs Weight lost goal: 145-150 lbs  TANITA  BODY COMP RESULTS  03/02/15 03/31/15 05/19/15 06/30/15 08/25/15   BMI (kg/m^2) 38.3 35.3 32.7 30.8 29.7   Fat Mass (lbs) 109 97.5 93 69.5 64.6   Fat Free Mass (lbs) 107 101.5 91.5 104.5 103   Total Body Water (lbs) 78.5 74.5 67 76.5 72.8

## 2016-02-15 ENCOUNTER — Other Ambulatory Visit: Payer: Self-pay

## 2016-02-24 ENCOUNTER — Encounter: Payer: BLUE CROSS/BLUE SHIELD | Attending: Surgery | Admitting: Dietician

## 2016-02-24 ENCOUNTER — Encounter: Payer: Self-pay | Admitting: Dietician

## 2016-02-24 DIAGNOSIS — Z9884 Bariatric surgery status: Secondary | ICD-10-CM | POA: Diagnosis not present

## 2016-02-24 DIAGNOSIS — Z713 Dietary counseling and surveillance: Secondary | ICD-10-CM | POA: Insufficient documentation

## 2016-02-24 NOTE — Progress Notes (Signed)
  Follow-up visit:  11.5 Months Post-Operative Sleeve gastrectomy Surgery  Medical Nutrition Therapy:  Appt start time: 200 end time:  240  Primary concerns today: Post-operative Bariatric Surgery Nutrition Management. Alexa BallRobin returns having lost another 16 pounds in the last 6 months. States she would like to reach 145 lbs for a "5-lb buffer." Running almost daily and weight training. Noticing small weight loss plateaus but reports she will not restrict intake. Still having occasional constipation. States she is sleeping better.   Surgery date: 03/17/15 Surgery type: sleeve gastrectomy [facility] lbs on 07/16/14 (highest weight 216 lbs) Weight today: 151 lbs Weight change: 16 lbs Total weight lost: 66 lbs Weight lost goal: 145-150 lbs  TANITA  BODY COMP RESULTS  03/02/15 03/31/15 05/19/15 06/30/15 08/25/15 02/24/16   BMI (kg/m^2) 38.3 35.3 32.7 30.8 29.7 26.7   Fat Mass (lbs) 109 97.5 93 69.5 64.6 55.2   Fat Free Mass (lbs) 107 101.5 91.5 104.5 103 95.8   Total Body Water (lbs) 78.5 74.5 67 76.5 72.8 67    Preferred Learning Style:   No preference indicated   Learning Readiness:   Ready  24-hr recall: B (8:30AM-throughout morning): Premier protein shake (30g) Snk (AM):   L (PM): collards and a protein shake Snk (PM):  Rest of protein shake (15g) D (PM): 2 oz salmon with steamed broccoli (14g) Snk (PM):   Fluid intake: 51-64 oz water Estimated total protein intake: 60 grams per day per patient  Medications: none Supplementation: taking  Using straws: no Drinking while eating: no Hair loss: noticing regrowth Carbonated beverages: no N/V/D/C: sometimes constipation, occasionally takes Milk of Magnesia Dumping syndrome: none  Recent physical activity:  Running 1.5 miles + walking 1 mile; weight training for 30 minutes (6 days a week)  Progress Towards Goal(s):  In progress.  Handouts given during visit include:  none   Nutritional Diagnosis:  [place]-3.3  Overweight/obesity related to past poor dietary habits and physical inactivity as evidenced by patient w/ recent sleeve gastrectomy surgery following dietary guidelines for continued weight loss.    Intervention:  Nutrition counseling/diet advancement.  Teaching Method Utilized:  Visual Auditory Hands on  Barriers to learning/adherence to lifestyle change: none  Demonstrated degree of understanding via:  Teach Back   Monitoring/Evaluation:  Dietary intake, exercise, and body weight. Follow up in 6 months for 18 month post-op visit.

## 2016-02-24 NOTE — Patient Instructions (Addendum)
   Ask your doctor about Magnesium supplement for constipation   Surgery date: 03/17/15 Surgery type: sleeve gastrectomy [facility] lbs on 07/16/14 (highest weight 216 lbs) Weight today: 151 lbs Weight change: 16 lbs Total weight lost: 66 lbs Weight lost goal: 145-150 lbs  TANITA  BODY COMP RESULTS  03/02/15 03/31/15 05/19/15 06/30/15 08/25/15 02/24/16   BMI (kg/m^2) 38.3 35.3 32.7 30.8 29.7 26.7   Fat Mass (lbs) 109 97.5 93 69.5 64.6 55.2   Fat Free Mass (lbs) 107 101.5 91.5 104.5 103 95.8   Total Body Water (lbs) 78.5 74.5 67 76.5 72.8 67

## 2016-07-31 IMAGING — CR DG UGI W/ GASTROGRAFIN
6 series · 18 of 20 positions shown · IV contrast (omnipaque)
Comparison: 07/29/2014

CLINICAL DATA: Postop day 1 status post sleeve gastrectomy.

EXAM:
WATER SOLUBLE UPPER GI SERIES
TECHNIQUE: Single-column upper GI series was performed using water soluble
contrast.
CONTRAST:  50mL OMNIPAQUE IOHEXOL 300 MG/ML  SOLN

[t abdomen supine]
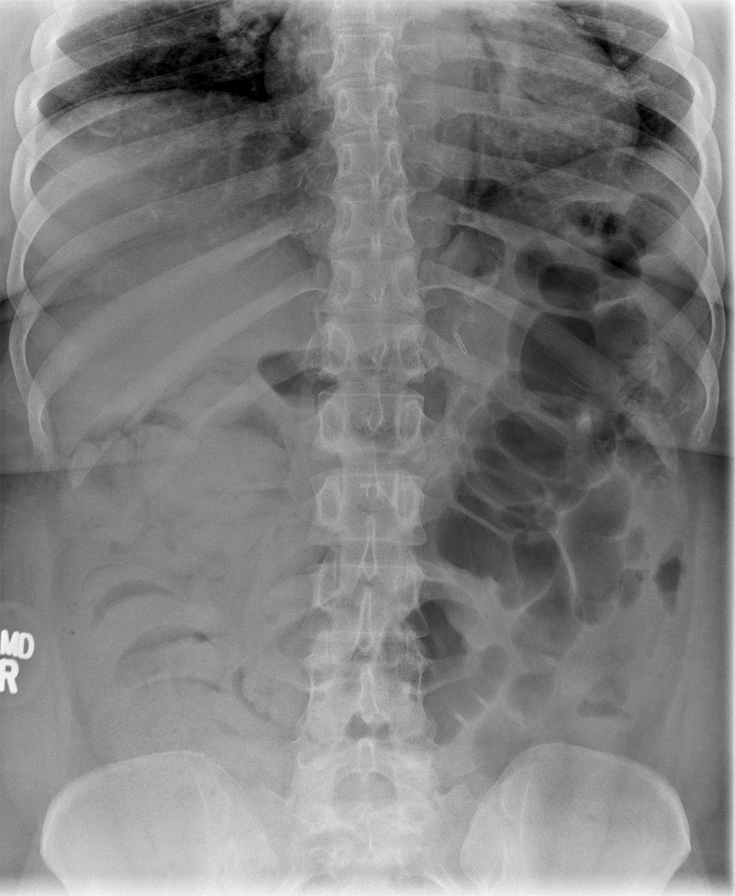

[Series 2: cp_standard · 0.35mm/px · 3 of 96 frames shown (1 of 5)]
[frame 11/96]
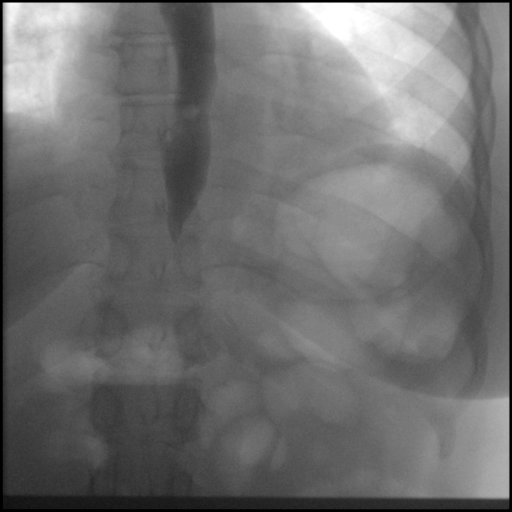
[frame 15/96]
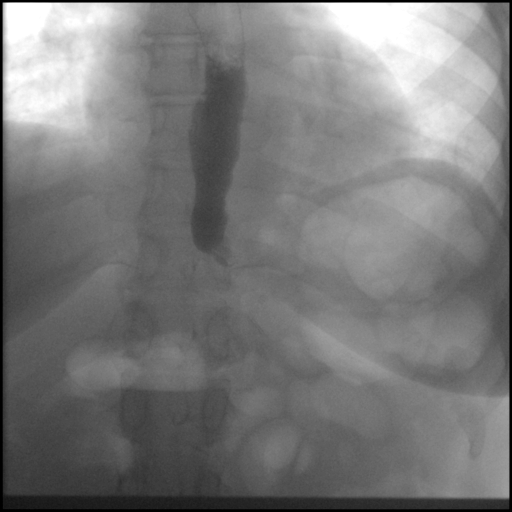
[frame 49/96]
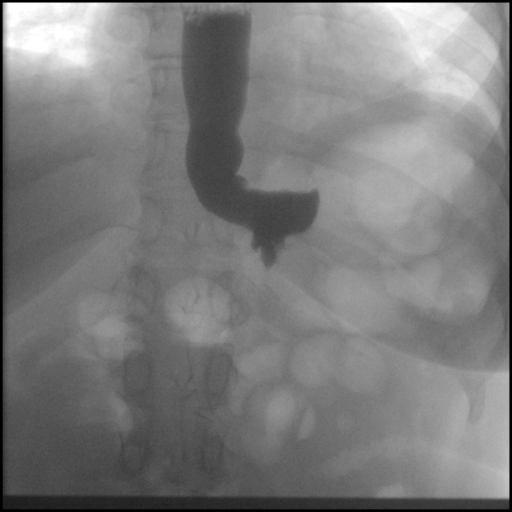

[Series 3: cp_standard · 0.35mm/px · 4 of 64 frames shown (2 of 5)]
[frame 3/64]
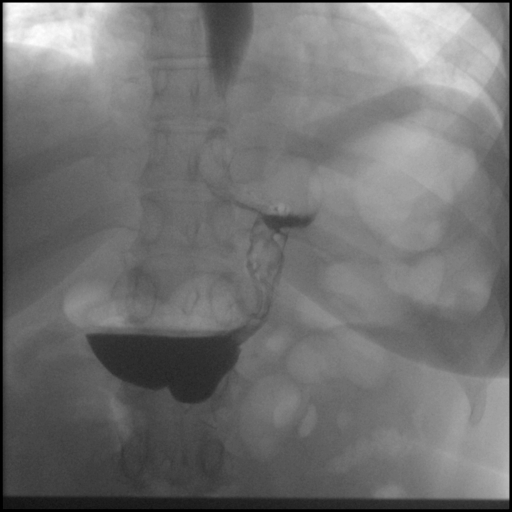
[frame 10/64]
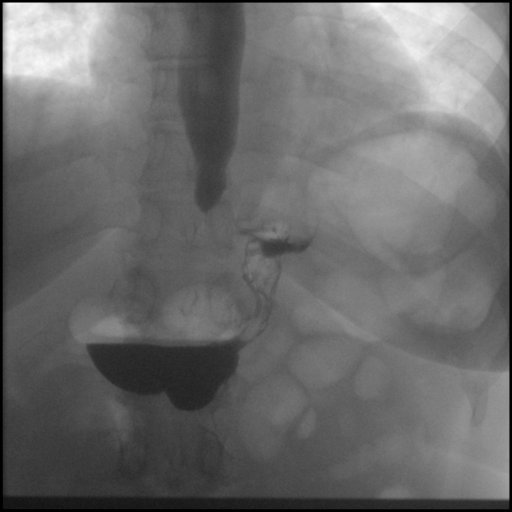
[frame 33/64]
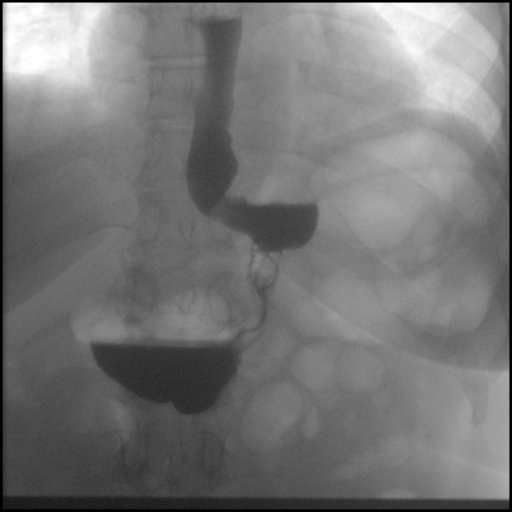
[frame 55/64]
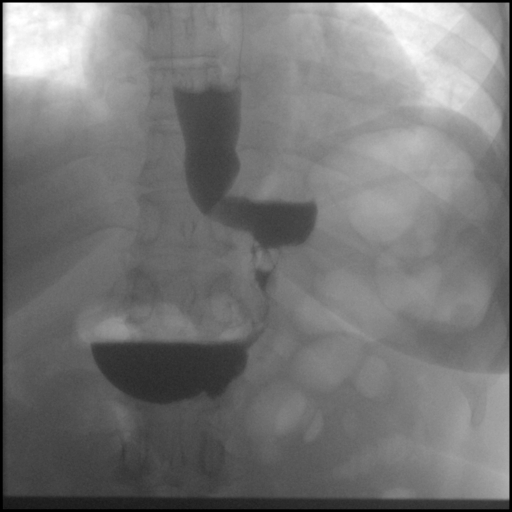

[Series 4: cp_standard · 0.35mm/px · 3 of 19 frames shown (3 of 5)]
[frame 3/19]
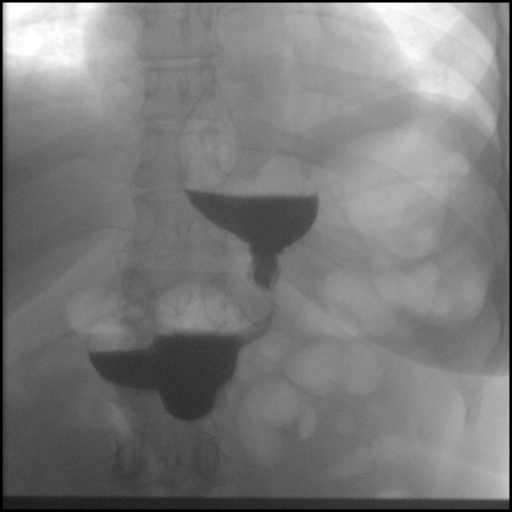
[frame 10/19]
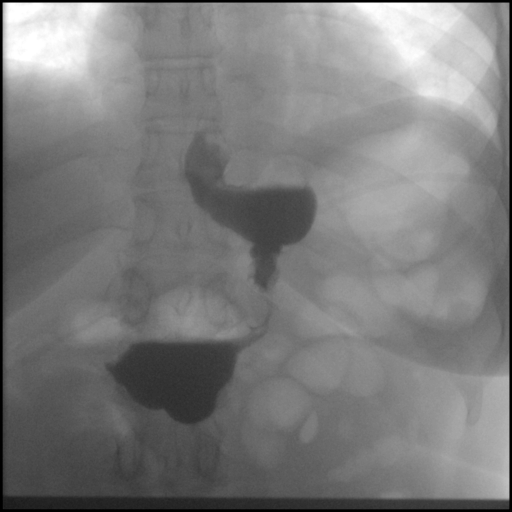
[frame 17/19]
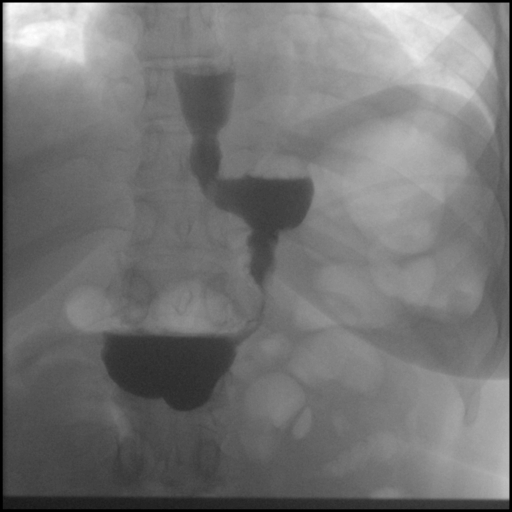

[Series 5: cp_standard · 0.35mm/px · 3 of 74 frames shown (4 of 5)]
[frame 12/74]
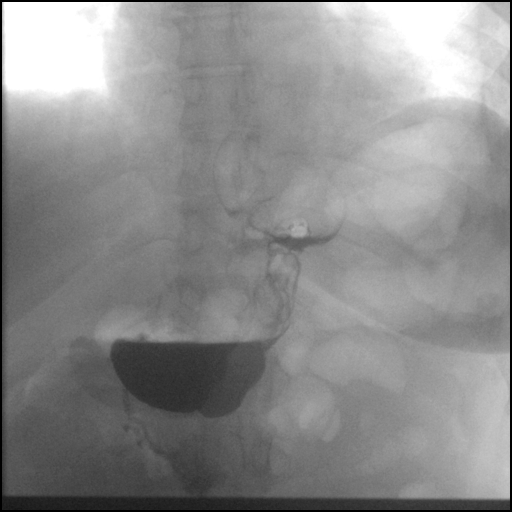
[frame 38/74]
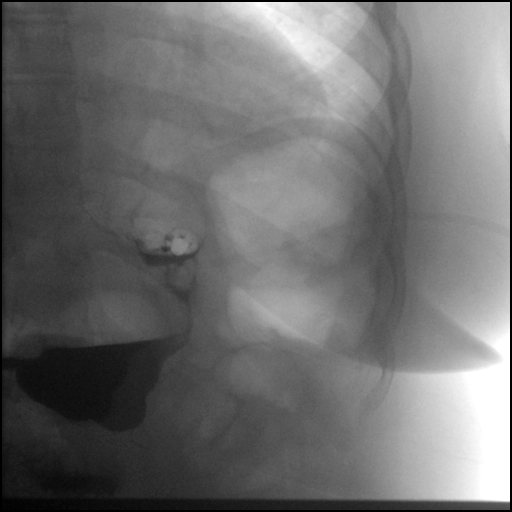
[frame 63/74]
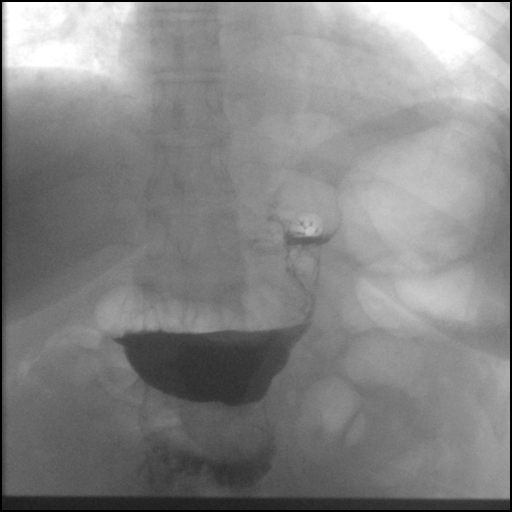

[Series 6: cp_standard · 0.34mm/px · 4 of 26 frames shown (5 of 5)]
[frame 4/26]
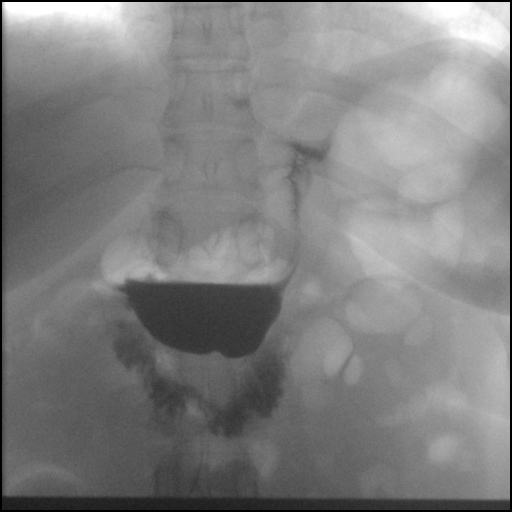
[frame 6/26]
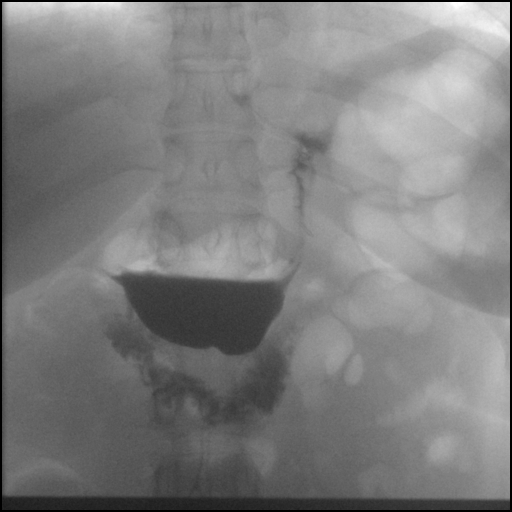
[frame 14/26]
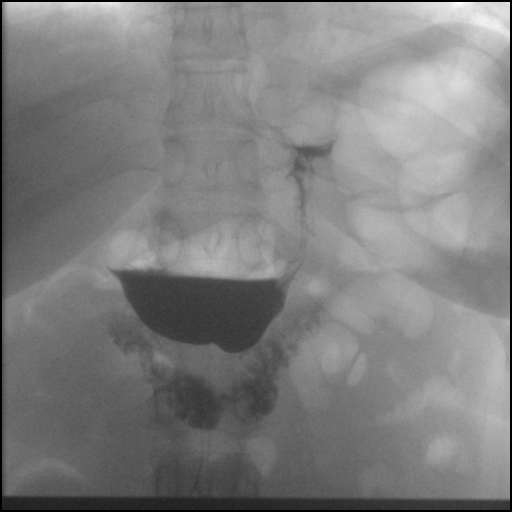
[frame 23/26]
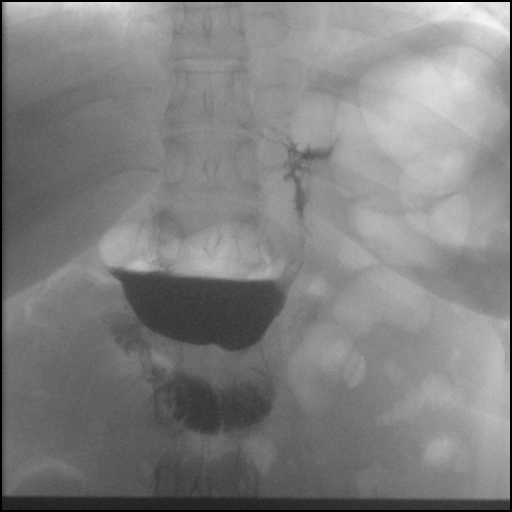

[18 of 20 positions shown; findings below may reference images not displayed]

FLUOROSCOPY TIME:  Radiation Exposure Index (as provided by the
fluoroscopic device): 88 microGy*m^2
FINDINGS: Initial KUB demonstrates expected clips from sleeve gastrectomy.
Mild prominence of stool in the proximal colon.

There is expected gastric narrowing along the sleeve gastrectomy
site, the normal postoperative appearance for this procedure.
Contrast does make its way pass the gastrectomy site into the distal
stomach and duodenum. No leak or complicating feature observed.
IMPRESSION: 1. Normal postoperative appearance for sleeve gastrectomy, without
leak or complicating feature.

## 2016-08-24 ENCOUNTER — Ambulatory Visit: Payer: Self-pay | Admitting: Skilled Nursing Facility1

## 2020-03-13 ENCOUNTER — Emergency Department
Admission: EM | Admit: 2020-03-13 | Discharge: 2020-03-13 | Disposition: A | Discharge status: Home / Self Care | Payer: BC Managed Care – PPO | Source: Home / Self Care

## 2020-03-13 ENCOUNTER — Encounter: Payer: Self-pay | Admitting: Emergency Medicine

## 2020-03-13 ENCOUNTER — Other Ambulatory Visit: Payer: Self-pay

## 2020-03-13 DIAGNOSIS — M1990 Unspecified osteoarthritis, unspecified site: Secondary | ICD-10-CM

## 2020-03-13 DIAGNOSIS — M25541 Pain in joints of right hand: Secondary | ICD-10-CM

## 2020-03-13 MED ORDER — PREDNISONE 20 MG PO TABS: ORAL_TABLET | ORAL | 1 refills | Status: AC

## 2020-03-13 NOTE — ED Triage Notes (Addendum)
Swelling and joint pain to bilateral pains - worse when she wakes up x 1 month  Min relief w/ tylenol - some relief w/ voltaren cream  Can not recall a tick bite or injury Covid vaccine & booster  Flu vaccine

## 2020-03-13 NOTE — ED Provider Notes (Signed)
[facility]    CSN: [id number] Arrival date & time: 03/13/20  1051      History   Chief Complaint Chief Complaint  Patient presents with  . Joint Swelling  . Joint Pain    HPI LUS Alexa Henderson is a 49 y.o. female.   This is the initial [facility] urgent care patient visit for this 49 year old woman who is complaining of hand pain and joint swelling.  Swelling and joint pain to bilateral pains - worse when she wakes up x 1 month  Can not recall a tick bite or injury Covid vaccine & booster  Flu vaccine  Patient has no other joint pains or stiffness.  She has been trying Voltaren gel without much benefit.  She has a small nodule on her pinky finger of her right hand at the DIP joint.     Past Medical History:  Diagnosis Date  . Arthritis   . Fibromyalgia   . Sleep apnea    HAS NEVER USED C-PAP    Patient Active Problem List   Diagnosis Date Noted  . Morbid obesity (HCC) 03/17/2015    Past Surgical History:  Procedure Laterality Date  . ABDOMINAL HYSTERECTOMY    . BREATH TEK H PYLORI N/A 07/16/2014   Procedure: BREATH TEK H PYLORI;  Surgeon: Ovidio Kin, MD;  Location: [facility];  Service: General;  Laterality: N/A;  . BREATH TEK H PYLORI N/A 09/01/2014   Procedure: BREATH TEK Richardo Priest;  Surgeon: Ovidio Kin, MD;  Location: [facility];  Service: General;  Laterality: N/A;  . CARPAL TUNNEL RELEASE    . CESAREAN SECTION    . CYSTOSCOPY    . KNEE SURGERY    . LAPAROSCOPIC GASTRIC SLEEVE RESECTION N/A 03/17/2015   Procedure: LAPAROSCOPIC GASTRIC SLEEVE RESECTION;  Surgeon: Ovidio Kin, MD;  Location: [facility];  Service: General;  Laterality: N/A;  . SYNOVECTOMY      OB History   No obstetric history on file.      Home Medications    Prior to Admission medications   Medication Sig Start Date End Date Taking? Authorizing Provider  predniSONE (DELTASONE) 20 MG tablet 2 daily with food 03/13/20   Elvina Sidle, MD  dicyclomine (BENTYL)  20 MG tablet Take 20 mg by mouth every 6 (six) hours as needed. Reported on 05/19/2015 Patient not taking: Reported on 03/13/2020 09/17/14 03/13/20  [provider]  pantoprazole (PROTONIX) 20 MG tablet Take 20 mg by mouth daily. Patient not taking: Reported on 03/13/2020  03/13/20  [provider]    Family History Family History  Problem Relation Age of Onset  . Cancer Mother        Breast  . Diabetes Father   . Diabetes Maternal Grandmother   . Hypertension Maternal Grandmother   . Cancer Maternal Grandfather        Bone  . Hypertension Maternal Grandfather   . Diabetes Paternal Grandmother   . Hypertension Paternal Grandmother   . Diabetes Paternal Grandfather   . Hypertension Paternal Grandfather   . Diabetes Daughter   . Diabetes Maternal Aunt   . Diabetes Paternal Aunt   . Healthy Sister   . Healthy Brother     Social History Social History   Tobacco Use  . Smoking status: Never Smoker  . Smokeless tobacco: Never Used  Vaping Use  . Vaping Use: Never used  Substance Use Topics  . Alcohol use: No  . Drug use: No     Allergies  Penicillins and Tramadol   Review of Systems Review of Systems  Constitutional: Negative.   Respiratory: Negative.   Cardiovascular: Negative.   Musculoskeletal: Positive for arthralgias.  Skin: Negative for rash.     Physical Exam Triage Vital Signs ED Triage Vitals  Enc Vitals Group     BP      Pulse      Resp      Temp      Temp src      SpO2      Weight      Height      Head Circumference      Peak Flow      Pain Score      Pain Loc      Pain Edu?      Excl. in GC?    No data found.  Updated Vital Signs BP 115/76 (BP Location: Right Arm)   Pulse 70   Temp 98.9 F (37.2 C) (Oral)   Resp 16   Ht 5\' 3"  (1.6 m)   Wt 72.6 kg   SpO2 100%   BMI 28.34 kg/m    Physical Exam Vitals and nursing note reviewed.  Constitutional:      Appearance: Normal appearance. She is obese.   Pulmonary:     Effort: Pulmonary effort is normal.  Musculoskeletal:        General: Swelling present. No signs of injury.     Comments: Patient has mild and diffuse bilateral PIP finger swelling and stiffness.  Skin:    General: Skin is warm and dry.  Neurological:     General: No focal deficit present.     Mental Status: She is alert.  Psychiatric:        Mood and Affect: Mood normal.      UC Treatments / Results  Labs (all labs ordered are listed, but only abnormal results are displayed) Labs Reviewed  CYCLIC CITRUL PEPTIDE ANTIBODY, IGG/IGA  SEDIMENTATION RATE    EKG   Radiology No results found.  Procedures Procedures (including critical care time)  Medications Ordered in UC Medications - No data to display  Initial Impression / Assessment and Plan / UC Course  I have reviewed the triage vital signs and the nursing notes.  Pertinent labs & imaging results that were available during my care of the patient were reviewed by me and considered in my medical decision making (see chart for details).    Final Clinical Impressions(s) / UC Diagnoses   Final diagnoses:  Arthritis     Discharge Instructions     We are running test for rheumatoid arthritis since this is the most likely diagnosis.  The results should be back early next week.  Please call your primary care doctor for follow-up    ED Prescriptions    Medication Sig Dispense Auth. Provider   predniSONE (DELTASONE) 20 MG tablet 2 daily with food 10 tablet , MD     I have reviewed the PDMP during this encounter.   Elvina Sidle, MD 03/13/20 1154

## 2020-03-13 NOTE — ED Notes (Signed)
Pt opted to have labs drawn with PCP due to the one lab draw needing to be drawn at room temperature and [facility] pick up time. Dr Milus Glazier updated

## 2020-03-13 NOTE — Discharge Instructions (Addendum)
We are running test for rheumatoid arthritis since this is the most likely diagnosis.  The results should be back early next week.  Please call your primary care doctor for follow-up
